# Patient Record
Sex: Male | Born: 1975 | Race: White | Hispanic: No | Marital: Married | Smoking: Never smoker
Health system: Southern US, Community
[De-identification: ages and names within clinical notes are randomized; demographics above are authoritative.]

## PROBLEM LIST (undated history)

## (undated) DIAGNOSIS — R030 Elevated blood-pressure reading, without diagnosis of hypertension: Secondary | ICD-10-CM

## (undated) DIAGNOSIS — Z85828 Personal history of other malignant neoplasm of skin: Secondary | ICD-10-CM

## (undated) DIAGNOSIS — R002 Palpitations: Secondary | ICD-10-CM

## (undated) DIAGNOSIS — IMO0001 Reserved for inherently not codable concepts without codable children: Secondary | ICD-10-CM

## (undated) DIAGNOSIS — F419 Anxiety disorder, unspecified: Secondary | ICD-10-CM

## (undated) HISTORY — PX: OTHER SURGICAL HISTORY: SHX169

## (undated) HISTORY — DX: Palpitations: R00.2

## (undated) HISTORY — DX: Elevated blood-pressure reading, without diagnosis of hypertension: R03.0

## (undated) HISTORY — DX: Reserved for inherently not codable concepts without codable children: IMO0001

## (undated) HISTORY — DX: Anxiety disorder, unspecified: F41.9

---
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
## 2006-07-17 DIAGNOSIS — Z86018 Personal history of other benign neoplasm: Secondary | ICD-10-CM

## 2006-07-17 HISTORY — DX: Personal history of other benign neoplasm: Z86.018

## 2006-12-12 ENCOUNTER — Ambulatory Visit: Payer: Self-pay | Admitting: Family Medicine

## 2006-12-12 DIAGNOSIS — L600 Ingrowing nail: Secondary | ICD-10-CM | POA: Insufficient documentation

## 2006-12-12 DIAGNOSIS — L2089 Other atopic dermatitis: Secondary | ICD-10-CM | POA: Insufficient documentation

## 2006-12-12 DIAGNOSIS — H9209 Otalgia, unspecified ear: Secondary | ICD-10-CM | POA: Insufficient documentation

## 2007-02-25 ENCOUNTER — Ambulatory Visit: Payer: Self-pay | Admitting: Family Medicine

## 2007-02-25 DIAGNOSIS — D485 Neoplasm of uncertain behavior of skin: Secondary | ICD-10-CM | POA: Insufficient documentation

## 2007-06-07 ENCOUNTER — Encounter (INDEPENDENT_AMBULATORY_CARE_PROVIDER_SITE_OTHER): Payer: Self-pay | Admitting: *Deleted

## 2007-06-10 ENCOUNTER — Ambulatory Visit: Payer: Self-pay | Admitting: Family Medicine

## 2007-06-10 DIAGNOSIS — R55 Syncope and collapse: Secondary | ICD-10-CM | POA: Insufficient documentation

## 2007-06-24 DIAGNOSIS — D239 Other benign neoplasm of skin, unspecified: Secondary | ICD-10-CM

## 2007-06-24 HISTORY — DX: Other benign neoplasm of skin, unspecified: D23.9

## 2010-08-12 ENCOUNTER — Emergency Department: Payer: Self-pay | Admitting: Emergency Medicine

## 2010-08-16 ENCOUNTER — Ambulatory Visit: Payer: Self-pay | Admitting: Internal Medicine

## 2010-10-16 HISTORY — PX: TRANSTHORACIC ECHOCARDIOGRAM: SHX275

## 2010-10-16 HISTORY — PX: OTHER SURGICAL HISTORY: SHX169

## 2010-12-17 ENCOUNTER — Encounter: Payer: Self-pay | Admitting: Family Medicine

## 2010-12-21 ENCOUNTER — Encounter: Payer: Self-pay | Admitting: Family Medicine

## 2010-12-21 ENCOUNTER — Ambulatory Visit (INDEPENDENT_AMBULATORY_CARE_PROVIDER_SITE_OTHER): Payer: Managed Care, Other (non HMO) | Admitting: Family Medicine

## 2010-12-21 DIAGNOSIS — Z0000 Encounter for general adult medical examination without abnormal findings: Secondary | ICD-10-CM

## 2010-12-21 DIAGNOSIS — R002 Palpitations: Secondary | ICD-10-CM | POA: Insufficient documentation

## 2010-12-21 LAB — COMPREHENSIVE METABOLIC PANEL
ALT: 17 U/L (ref 0–53)
AST: 17 U/L (ref 0–37)
Albumin: 4.8 g/dL (ref 3.5–5.2)
BUN: 10 mg/dL (ref 6–23)
Calcium: 9.2 mg/dL (ref 8.4–10.5)
Chloride: 106 mEq/L (ref 96–112)
Potassium: 4.5 mEq/L (ref 3.5–5.1)
Sodium: 141 mEq/L (ref 135–145)
Total Protein: 7.7 g/dL (ref 6.0–8.3)

## 2010-12-21 LAB — CBC WITH DIFFERENTIAL/PLATELET
Eosinophils Absolute: 0 10*3/uL (ref 0.0–0.7)
Lymphs Abs: 1.1 10*3/uL (ref 0.7–4.0)
MCHC: 34.4 g/dL (ref 30.0–36.0)
MCV: 95.6 fl (ref 78.0–100.0)
Monocytes Absolute: 0.6 10*3/uL (ref 0.1–1.0)
Neutrophils Relative %: 71.8 % (ref 43.0–77.0)
Platelets: 315 10*3/uL (ref 150.0–400.0)

## 2010-12-21 LAB — LIPID PANEL
Cholesterol: 195 mg/dL (ref 0–200)
Total CHOL/HDL Ratio: 5
Triglycerides: 59 mg/dL (ref 0.0–149.0)

## 2010-12-21 NOTE — Patient Instructions (Signed)
I am reassured by your story and cardiac work up so far  Please send for last [facility] ER notes/ labs  Please send for notes from [facility] cardiology  Labs today If you want to try low dose beta blocker ( to lower bp and pulse and poss help anxiety)- call and let me know  If you want to see a counselor for stress issues- let me know

## 2010-12-21 NOTE — Progress Notes (Signed)
Subjective:    Patient ID: William Macias, male    DOB: 09-12-1975, 35 y.o.   MRN: [id number]  HPI Here to get est for care  Saw Dr Ermalene Searing back in 08 Then saw Dr Burnell Blanks at [facility]- few months ago   (this is much closer0 Overall very healthy    Has elevated bp  A few months ago - was having trouble sleeping - for palpitations- heart felt like it was beating out of his chest (beating hard) Went to [facility] and bp was very elevated -- so much that they sent him to the ER ([facility])  Did blood tests and EKG and then -- went to [facility] cardiol after that (did echo and holter monitor)  Did not do stress tests   Still has "hard" heartbeat at night  They put him on generic xanax and something for bp (thinks B blocker possibly) -- small doses  Not on it right now  Took both drugs for 1 month -- they helped a little - but not a big difference   bp at cardiology may have been a bit high   Never had anxiety in past or seen a counselor  His mother is doing well  That is improved - but also has financial worries  Lost a pet in January- very hard on him    Mother had cabg   Grandparent with colon cancer   Td 2012-- was in feb Had a physical in February - all was ok  Did not do blood work due to having eaten   Patient Active Problem List  Diagnoses  . NEOPLASM, SKIN, UNCERTAIN BEHAVIOR  . DERMATITIS, OTHER ATOPIC  . SYNCOPE, VASOVAGAL  . Routine general medical examination at a health care facility  . Palpitations   Past Medical History  Diagnosis Date  . Elevated BP   . Palpitations   . Anxiety    Past Surgical History  Procedure Date  . None   . Transthoracic echocardiogram 4/12    normal per pt [facility]  . Holter monitor 4/12    nl per pt [facility]   History  Substance Use Topics  . Smoking status: Never Smoker   . Smokeless tobacco: Not on file  . Alcohol Use: No   Family History  Problem Relation Age of Onset  . Depression  Mother   . Arthritis Mother   . Heart disease Mother     had triple by pass  . Alcohol abuse Maternal Grandfather   . Cancer Maternal Grandfather     colon cA  . Heart disease Maternal Grandmother   . Diabetes Maternal Grandmother    No Known Allergies Current Outpatient Prescriptions on File Prior to Visit  Medication Sig Dispense Refill  . desonide (DESOWEN) 0.05 % cream Apply to ear canals bilaterally two times a day as needed for dry skin             Review of Systems Review of Systems  Constitutional: Negative for fever, appetite change, fatigue and unexpected weight change.  Eyes: Negative for pain and visual disturbance.  Respiratory: Negative for cough and shortness of breath.   Cardiovascular: Negative.  for cp or sob/ pos for palpitations Gastrointestinal: Negative for nausea, diarrhea and constipation.  Genitourinary: Negative for urgency and frequency.  Skin: Negative for pallor. or rash Neurological: Negative for weakness, light-headedness, numbness and headaches.  Hematological: Negative for adenopathy. Does not bruise/bleed easily.  Psychiatric/Behavioral: Negative for dysphoric mood. Some anxiety  and worry        Objective:   Physical Exam  Constitutional: He appears well-developed and well-nourished. No distress.  HENT:  Head: Normocephalic and atraumatic.  Right Ear: External ear normal.  Left Ear: External ear normal.  Nose: Nose normal.  Mouth/Throat: Oropharynx is clear and moist.  Eyes: Conjunctivae and EOM are normal. Pupils are equal, round, and reactive to light.  Neck: Normal range of motion. Neck supple. No JVD present. Carotid bruit is not present. No thyromegaly present.  Cardiovascular: Normal rate, regular rhythm, normal heart sounds and intact distal pulses.   No murmur heard. Pulmonary/Chest: Breath sounds normal. No respiratory distress. He has no wheezes.  Abdominal: Soft. Bowel sounds are normal. He exhibits no distension, no  abdominal bruit and no mass. There is no tenderness.  Musculoskeletal: He exhibits no edema and no tenderness.  Lymphadenopathy:    He has no cervical adenopathy.  Neurological: He is alert. He has normal reflexes. Coordination normal.  Skin: Skin is warm and dry. No rash noted. No erythema. No pallor.  Psychiatric:       Pt seems mildly anxious Normal eye contact and communication skills          Assessment & Plan:

## 2010-12-21 NOTE — Assessment & Plan Note (Signed)
Rev story in detail S/p nl cardiac work up - sent for that  Suspect anx and stress rxn play a role Given opt of beta blocker- will think about that Also offered counseling Check cbc and tsh today

## 2010-12-21 NOTE — Assessment & Plan Note (Signed)
Pt already had PE --just needs lab today

## 2010-12-22 ENCOUNTER — Encounter: Payer: Self-pay | Admitting: Family Medicine

## 2010-12-27 ENCOUNTER — Telehealth: Payer: Self-pay

## 2010-12-27 NOTE — Telephone Encounter (Signed)
Patient notified as instructed by telephone. Fasting Lab appt scheduled as instructed 06/28/11 8:15with f/u appt with Dr Milinda Antis 07/04/11 at 8:00.

## 2010-12-27 NOTE — Telephone Encounter (Signed)
Message copied by Patience Musca on Tue Dec 27, 2010  6:19 PM ------      Message from: Roxy Manns A      Created: Thu Dec 22, 2010  7:39 PM       Cholesterol is just a little high (other labs are fine )      LDL in 140s and goal is under 130 (even better under 100)      Avoid red meat/ fried foods/ egg yolks/ fatty breakfast meats/ butter, cheese and high fat dairy/ and shellfish       Recheck lipid panel 272 in 6 months after watching diet and then f/u please

## 2011-06-20 ENCOUNTER — Other Ambulatory Visit: Payer: Self-pay | Admitting: Family Medicine

## 2011-06-20 DIAGNOSIS — E7800 Pure hypercholesterolemia, unspecified: Secondary | ICD-10-CM

## 2011-06-28 ENCOUNTER — Other Ambulatory Visit (INDEPENDENT_AMBULATORY_CARE_PROVIDER_SITE_OTHER): Payer: Managed Care, Other (non HMO)

## 2011-06-28 DIAGNOSIS — E7800 Pure hypercholesterolemia, unspecified: Secondary | ICD-10-CM

## 2011-06-28 LAB — LIPID PANEL
HDL: 39.4 mg/dL (ref >39.00)
Total CHOL/HDL Ratio: 5
Triglycerides: 77 mg/dL (ref 0.0–149.0)
VLDL: 15.4 mg/dL (ref 0.0–40.0)

## 2011-07-04 ENCOUNTER — Encounter: Payer: Self-pay | Admitting: Family Medicine

## 2011-07-04 ENCOUNTER — Ambulatory Visit (INDEPENDENT_AMBULATORY_CARE_PROVIDER_SITE_OTHER): Payer: Managed Care, Other (non HMO) | Admitting: Family Medicine

## 2011-07-04 VITALS — BP 132/78 | HR 76 | Temp 97.7°F | Ht 71.5 in | Wt 174.5 lb

## 2011-07-04 DIAGNOSIS — R002 Palpitations: Secondary | ICD-10-CM

## 2011-07-04 DIAGNOSIS — Z23 Encounter for immunization: Secondary | ICD-10-CM

## 2011-07-04 DIAGNOSIS — E785 Hyperlipidemia, unspecified: Secondary | ICD-10-CM | POA: Insufficient documentation

## 2011-07-04 NOTE — Assessment & Plan Note (Signed)
These seem to be stress related- and not bothersome enough to treat Pt has had cardiac w/u He is aware that B blocker is an option if they become more of a problem

## 2011-07-04 NOTE — Patient Instructions (Signed)
Avoid red meat/ fried foods/ egg yolks/ fatty breakfast meats/ butter, cheese and high fat dairy/ and shellfish   Cholesterol looks better  Try to exercise regularly outdoors or indoors  Your physical will be due June- July

## 2011-07-04 NOTE — Assessment & Plan Note (Signed)
Lipids are improved with better diet (less beef) Disc goals for lipids and reasons to control them Rev labs with pt Rev low sat fat diet in detail

## 2011-07-04 NOTE — Progress Notes (Signed)
Subjective:    Patient ID: William Macias, male    DOB: 1975-08-06, 35 y.o.   MRN: [id number]  HPI Here for f/u of lipids and palpitations  bp is 132/78  Wt is up 4 lb  Lipids are improved Lab Results  Component Value Date   CHOL 180 06/28/2011   CHOL 195 12/21/2010   Lab Results  Component Value Date   HDL 39.40 06/28/2011   HDL 40.00 12/21/2010   Lab Results  Component Value Date   LDLCALC 125* 06/28/2011   LDLCALC 143* 12/21/2010   Lab Results  Component Value Date   TRIG 77.0 06/28/2011   TRIG 59.0 12/21/2010   Lab Results  Component Value Date   CHOLHDL 5 06/28/2011   CHOLHDL 5 12/21/2010   No results found for this basename: LDLDIRECT   LDL is down from 143 to 125 Did a great job  Is now cutting out steak and red meat and walking 30 minutes a day -- aerobic exercise  Is staying away from fried foods more than before  Mother had CAD with triple bypass , ? Chol status   Palpitations - are improved overall, they come and go  Notices it when he goes to bed  Is not bothered by it   Patient Active Problem List  Diagnoses  . DERMATITIS, OTHER ATOPIC  . SYNCOPE, VASOVAGAL  . Routine general medical examination at a health care facility  . Palpitations  . Hyperlipidemia   Past Medical History  Diagnosis Date  . Elevated BP   . Palpitations   . Anxiety    Past Surgical History  Procedure Date  . None   . Transthoracic echocardiogram 4/12    normal per pt [facility]  . Holter monitor 4/12    nl per pt [facility]   History  Substance Use Topics  . Smoking status: Never Smoker   . Smokeless tobacco: Not on file  . Alcohol Use: No   Family History  Problem Relation Age of Onset  . Depression Mother   . Arthritis Mother   . Heart disease Mother     had triple by pass  . Alcohol abuse Maternal Grandfather   . Cancer Maternal Grandfather     colon cA  . Heart disease Maternal Grandmother   . Diabetes Maternal Grandmother    No Known  Allergies Current Outpatient Prescriptions on File Prior to Visit  Medication Sig Dispense Refill  . desonide (DESOWEN) 0.05 % cream Apply to ear canals bilaterally two times a day as needed for dry skin          Review of Systems Review of Systems  Constitutional: Negative for fever, appetite change, fatigue and unexpected weight change.  Eyes: Negative for pain and visual disturbance.  Respiratory: Negative for cough and shortness of breath.   Cardiovascular: Negative for cp or sob Gastrointestinal: Negative for nausea, diarrhea and constipation.  Genitourinary: Negative for urgency and frequency.  Skin: Negative for pallor or rash   Neurological: Negative for weakness, light-headedness, numbness and headaches.  Hematological: Negative for adenopathy. Does not bruise/bleed easily.  Psychiatric/Behavioral: Negative for dysphoric mood. The patient is not nervous/anxious.  but has a lot of stress with new baby on the way/ work/ etc         Objective:   Physical Exam  Constitutional: He appears well-developed and well-nourished. No distress.  HENT:  Head: Normocephalic and atraumatic.  Mouth/Throat: Oropharynx is clear and moist.  Eyes: Conjunctivae and EOM  are normal. Pupils are equal, round, and reactive to light. No scleral icterus.  Neck: Normal range of motion. Neck supple. No JVD present. Carotid bruit is not present. No thyromegaly present.  Cardiovascular: Normal rate, regular rhythm, normal heart sounds and intact distal pulses.  Exam reveals no gallop.   Pulmonary/Chest: Effort normal and breath sounds normal. No respiratory distress. He has no wheezes.  Musculoskeletal: He exhibits no edema.  Lymphadenopathy:    He has no cervical adenopathy.  Neurological: He is alert. He has normal reflexes. Coordination normal.  Skin: Skin is warm and dry. No pallor.  Psychiatric: He has a normal mood and affect.          Assessment & Plan:

## 2012-01-02 ENCOUNTER — Encounter: Payer: Self-pay | Admitting: Family Medicine

## 2012-01-02 ENCOUNTER — Ambulatory Visit (INDEPENDENT_AMBULATORY_CARE_PROVIDER_SITE_OTHER): Payer: Managed Care, Other (non HMO) | Admitting: Family Medicine

## 2012-01-02 VITALS — BP 116/76 | HR 64 | Temp 97.9°F | Ht 72.5 in | Wt 172.5 lb

## 2012-01-02 DIAGNOSIS — E785 Hyperlipidemia, unspecified: Secondary | ICD-10-CM

## 2012-01-02 DIAGNOSIS — Z0000 Encounter for general adult medical examination without abnormal findings: Secondary | ICD-10-CM

## 2012-01-02 LAB — COMPREHENSIVE METABOLIC PANEL
BUN: 13 mg/dL (ref 6–23)
CO2: 27 mEq/L (ref 19–32)
Calcium: 8.4 mg/dL (ref 8.4–10.5)
Chloride: 108 mEq/L (ref 96–112)
Creatinine, Ser: 1 mg/dL (ref 0.4–1.5)
GFR: 92.06 mL/min (ref >60.00)

## 2012-01-02 LAB — TSH: TSH: 1.27 u[IU]/mL (ref 0.35–5.50)

## 2012-01-02 LAB — CBC WITH DIFFERENTIAL/PLATELET
Basophils Relative: 0.8 % (ref 0.0–3.0)
Eosinophils Absolute: 0 10*3/uL (ref 0.0–0.7)
Lymphocytes Relative: 21.3 % (ref 12.0–46.0)
MCV: 95.5 fl (ref 78.0–100.0)
Monocytes Relative: 10.9 % (ref 3.0–12.0)
Neutro Abs: 3.3 10*3/uL (ref 1.4–7.7)
Neutrophils Relative %: 66.2 % (ref 43.0–77.0)
Platelets: 302 10*3/uL (ref 150.0–400.0)
RDW: 13.7 % (ref 11.5–14.6)
WBC: 5 10*3/uL (ref 4.5–10.5)

## 2012-01-02 LAB — LIPID PANEL
LDL Cholesterol: 118 mg/dL — ABNORMAL HIGH (ref 0–99)
VLDL: 12 mg/dL (ref 0.0–40.0)

## 2012-01-02 NOTE — Assessment & Plan Note (Signed)
Lipids mildly high in the past- rev Rev low sat fat diet  Lab today

## 2012-01-02 NOTE — Patient Instructions (Addendum)
Labs today Keep working on healthy diet and exercise  Wear sun protection  I'm glad you are doing well

## 2012-01-02 NOTE — Assessment & Plan Note (Signed)
Reviewed health habits including diet and exercise and skin cancer prevention Also reviewed health mt list, fam hx and immunizations  Wellness labs today 

## 2012-01-02 NOTE — Progress Notes (Signed)
Subjective:    Patient ID: William Macias, male    DOB: [birth year], 36 y.o.   MRN: [id number]  HPI Here for health maintenance exam and to review chronic medical problems   Is feeling good  Much better actually   Has a new baby - was 1 mo premature but no problems  She was 2 mo on Friday  Is getting a fair amt of sleep- for the most part an easy baby who sleeps well   bp good 116/76 Wt is down 2 lb with bmi of 23  Health habits Is eating a fairly healthy diet - gets enough fruit and veggies/ no fast food  Some exercise - on and off  Work is ok - a little slow right now   Needs to do labs today   Lipids due for a check Previous Lab Results  Component Value Date   CHOL 180 06/28/2011   HDL 39.40 06/28/2011   LDLCALC 125* 06/28/2011   TRIG 77.0 06/28/2011   CHOLHDL 5 06/28/2011   diet- overall a little better , could always be better  Avoids fried foods and red meat for the most part occ southern breakfast   Td 2/12  Had a flu shot   No new family hx of illness  Patient Active Problem List  Diagnosis  . DERMATITIS, OTHER ATOPIC  . SYNCOPE, VASOVAGAL  . Routine general medical examination at a health care facility  . Palpitations  . Hyperlipidemia   Past Medical History  Diagnosis Date  . Elevated BP   . Palpitations   . Anxiety    Past Surgical History  Procedure Date  . None   . Transthoracic echocardiogram 4/12    normal per pt [facility]  . Holter monitor 4/12    nl per pt [facility]   History  Substance Use Topics  . Smoking status: Never Smoker   . Smokeless tobacco: Not on file  . Alcohol Use: No   Family History  Problem Relation Age of Onset  . Depression Mother   . Arthritis Mother   . Heart disease Mother     had triple by pass  . Alcohol abuse Maternal Grandfather   . Cancer Maternal Grandfather     colon cA  . Heart disease Maternal Grandmother   . Diabetes Maternal Grandmother    No Known Allergies Current  Outpatient Prescriptions on File Prior to Visit  Medication Sig Dispense Refill  . desonide (DESOWEN) 0.05 % cream Apply to ear canals bilaterally two times a day as needed for dry skin             Review of Systems Review of Systems  Constitutional: Negative for fever, appetite change, fatigue and unexpected weight change.  Eyes: Negative for pain and visual disturbance.  Respiratory: Negative for cough and shortness of breath.   Cardiovascular: Negative for cp or palpitations    Gastrointestinal: Negative for nausea, diarrhea and constipation.  Genitourinary: Negative for urgency and frequency.  Skin: Negative for pallor or rash   Neurological: Negative for weakness, light-headedness, numbness and headaches.  Hematological: Negative for adenopathy. Does not bruise/bleed easily.  Psychiatric/Behavioral: Negative for dysphoric mood. The patient is not nervous/anxious.         Objective:   Physical Exam  Constitutional: He appears well-developed and well-nourished. No distress.  HENT:  Head: Normocephalic and atraumatic.  Right Ear: External ear normal.  Left Ear: External ear normal.  Nose: Nose normal.  Mouth/Throat: Oropharynx is  clear and moist.  Eyes: Conjunctivae and EOM are normal. Pupils are equal, round, and reactive to light. No scleral icterus.  Neck: Normal range of motion. Neck supple. No JVD present. Carotid bruit is not present. No thyromegaly present.  Cardiovascular: Normal rate, regular rhythm, normal heart sounds and intact distal pulses.  Exam reveals no gallop.   Pulmonary/Chest: Effort normal and breath sounds normal. No respiratory distress. He has no wheezes.  Abdominal: Soft. Bowel sounds are normal. He exhibits no distension, no abdominal bruit and no mass. There is no tenderness.  Musculoskeletal: Normal range of motion. He exhibits no edema and no tenderness.  Lymphadenopathy:    He has no cervical adenopathy.  Neurological: He is alert. He has  normal reflexes. No cranial nerve deficit. He exhibits normal muscle tone. Coordination normal.  Skin: Skin is warm and dry. No rash noted. No erythema. No pallor.  Psychiatric: He has a normal mood and affect.          Assessment & Plan:

## 2012-03-13 ENCOUNTER — Ambulatory Visit (INDEPENDENT_AMBULATORY_CARE_PROVIDER_SITE_OTHER): Payer: Managed Care, Other (non HMO) | Admitting: Family Medicine

## 2012-03-13 ENCOUNTER — Encounter: Payer: Self-pay | Admitting: Family Medicine

## 2012-03-13 VITALS — BP 128/82 | HR 74 | Temp 98.4°F | Ht 73.0 in | Wt 178.8 lb

## 2012-03-13 DIAGNOSIS — T148XXA Other injury of unspecified body region, initial encounter: Secondary | ICD-10-CM

## 2012-03-13 DIAGNOSIS — W57XXXA Bitten or stung by nonvenomous insect and other nonvenomous arthropods, initial encounter: Secondary | ICD-10-CM

## 2012-03-13 MED ORDER — MUPIROCIN 2 % EX OINT: TOPICAL_OINTMENT | Freq: Every day | CUTANEOUS | Status: AC

## 2012-03-13 MED ORDER — MOMETASONE FUROATE 0.1 % EX CREA: TOPICAL_CREAM | Freq: Every day | CUTANEOUS | Status: AC

## 2012-03-13 NOTE — Assessment & Plan Note (Signed)
1-2 cm area of erythema and induration on L forearm with small scab consistent with insect bite  No abcess/ bruising noted  Will tx with bactroban qd and elocon cream qd  (am/ pm) Disc keeping clean Will call if worse/ pain/ rash or fever (or other symptoms)

## 2012-03-13 NOTE — Progress Notes (Signed)
  Subjective:    Patient ID: William Macias, male    DOB: May 08, 1976, 36 y.o.   MRN: [id number]  HPI Here with an insect bite on his L arm  Worried it may have been a spider   Woke up with a red area on his L forearm on Monday  Is a little sore  Does not remember being bitten A little dark around the area of red   Never saw a tick   Was outdoors at a pool on Sunday- there were a lot of bugs around   No fever or rash No itching   Patient Active Problem List  Diagnosis  . DERMATITIS, OTHER ATOPIC  . SYNCOPE, VASOVAGAL  . Routine general medical examination at a health care facility  . Hyperlipidemia   Past Medical History  Diagnosis Date  . Elevated BP   . Palpitations   . Anxiety    Past Surgical History  Procedure Date  . None   . Transthoracic echocardiogram 4/12    normal per pt [facility]  . Holter monitor 4/12    nl per pt [facility]   History  Substance Use Topics  . Smoking status: Never Smoker   . Smokeless tobacco: Not on file  . Alcohol Use: No   Family History  Problem Relation Age of Onset  . Depression Mother   . Arthritis Mother   . Heart disease Mother     had triple by pass  . Alcohol abuse Maternal Grandfather   . Cancer Maternal Grandfather     colon cA  . Heart disease Maternal Grandmother   . Diabetes Maternal Grandmother    No Known Allergies Current Outpatient Prescriptions on File Prior to Visit  Medication Sig Dispense Refill  . desonide (DESOWEN) 0.05 % cream Apply to ear canals bilaterally two times a day as needed for dry skin           Review of Systems     Objective:   Physical Exam  Constitutional: He appears well-developed and well-nourished. No distress.  HENT:  Head: Normocephalic and atraumatic.  Eyes: Conjunctivae and EOM are normal. Pupils are equal, round, and reactive to light. Right eye exhibits no discharge.  Neck: Normal range of motion. Neck supple.  Cardiovascular: Normal rate and regular  rhythm.   Pulmonary/Chest: Effort normal and breath sounds normal. He has no wheezes.  Lymphadenopathy:    He has no cervical adenopathy.  Skin: Skin is warm and dry. No rash noted. There is erythema. No pallor.       L forearm 1-2 cm round area of erythema and induration - tiny scab in middle Is warm Consistent with insect bite  No rash  Psychiatric: He has a normal mood and affect.          Assessment & Plan:

## 2012-03-13 NOTE — Patient Instructions (Addendum)
Keep insect bite clean with antibacterial soap and water  Use elocon cream (steroid cream) once daily , and the bactroban (antibiotic) once daily  If worse , increased redness, fever, rash -- or not improving- please call  Warm compress is ok also

## 2014-04-24 ENCOUNTER — Ambulatory Visit (INDEPENDENT_AMBULATORY_CARE_PROVIDER_SITE_OTHER): Payer: BC Managed Care – PPO | Admitting: Family Medicine

## 2014-04-24 ENCOUNTER — Encounter: Payer: Self-pay | Admitting: Family Medicine

## 2014-04-24 VITALS — BP 104/64 | HR 79 | Temp 98.5°F | Ht 73.0 in | Wt 174.5 lb

## 2014-04-24 DIAGNOSIS — J209 Acute bronchitis, unspecified: Secondary | ICD-10-CM | POA: Insufficient documentation

## 2014-04-24 MED ORDER — BENZONATATE 200 MG PO CAPS: 200.0000 mg | ORAL_CAPSULE | Freq: Three times a day (TID) | ORAL | Status: DC | PRN

## 2014-04-24 MED ORDER — GUAIFENESIN-CODEINE 100-10 MG/5ML PO SYRP: 5.0000 mL | ORAL_SOLUTION | Freq: Four times a day (QID) | ORAL | Status: DC | PRN

## 2014-04-24 MED ORDER — AZITHROMYCIN 250 MG PO TABS: ORAL_TABLET | ORAL | Status: DC

## 2014-04-24 NOTE — Progress Notes (Signed)
Subjective:    Patient ID: William Macias, male    DOB: 11-Jan-1976, 38 y.o.   MRN: [id number]  HPI Here for uri symptoms  Sick for at least 3 weeks  Is getting worse instead of better   Still coughing -prod-- with a sore chest from coughing - is bringing up yellow mucous  Runny and stuffy nose   No fever since the beginning   No wheezing   Using delsym -not a lot of help   Patient Active Problem List   Diagnosis Date Noted  . Acute bronchitis 04/24/2014  . Insect bite 03/13/2012  . Hyperlipidemia 07/04/2011  . Routine general medical examination at a health care facility 12/21/2010  . SYNCOPE, VASOVAGAL 06/10/2007  . DERMATITIS, OTHER ATOPIC 12/12/2006   Past Medical History  Diagnosis Date  . Elevated BP   . Palpitations   . Anxiety    Past Surgical History  Procedure Laterality Date  . None    . Transthoracic echocardiogram  4/12    normal per pt [facility]  . Holter monitor  4/12    nl per pt [facility]   History  Substance Use Topics  . Smoking status: Never Smoker   . Smokeless tobacco: Not on file  . Alcohol Use: No   Family History  Problem Relation Age of Onset  . Depression Mother   . Arthritis Mother   . Heart disease Mother     had triple by pass  . Alcohol abuse Maternal Grandfather   . Cancer Maternal Grandfather     colon cA  . Heart disease Maternal Grandmother   . Diabetes Maternal Grandmother    No Known Allergies No current outpatient prescriptions on file prior to visit.   No current facility-administered medications on file prior to visit.    Review of Systems    Review of Systems  Constitutional: Negative for fever, appetite change,  and unexpected weight change.  ENT pos for cong and rhinorrhea and neg for sinus pain  Eyes: Negative for pain and visual disturbance.  Respiratory: Negative for wheeze  and shortness of breath.   Cardiovascular: Negative for cp or palpitations    Gastrointestinal: Negative for  nausea, diarrhea and constipation.  Genitourinary: Negative for urgency and frequency.  Skin: Negative for pallor or rash   Neurological: Negative for weakness, light-headedness, numbness and headaches.  Hematological: Negative for adenopathy. Does not bruise/bleed easily.  Psychiatric/Behavioral: Negative for dysphoric mood. The patient is not nervous/anxious.      Objective:   Physical Exam  Constitutional: He appears well-developed and well-nourished. No distress.  HENT:  Head: Normocephalic and atraumatic.  Right Ear: External ear normal.  Left Ear: External ear normal.  Mouth/Throat: Oropharynx is clear and moist. No oropharyngeal exudate.  Nares are injected and congested  No sinus tenderness  Throat clear    Eyes: Conjunctivae and EOM are normal. Pupils are equal, round, and reactive to light. Right eye exhibits no discharge. Left eye exhibits no discharge.  Neck: Normal range of motion. Neck supple.  Cardiovascular: Normal rate, regular rhythm and normal heart sounds.   Pulmonary/Chest: Effort normal and breath sounds normal. No respiratory distress. He has no wheezes. He has no rales.  Harsh bs Few scattered rhonchi    Lymphadenopathy:    He has no cervical adenopathy.  Neurological: He is alert.  Skin: Skin is warm and dry. No rash noted.  Psychiatric: He has a normal mood and affect.  Assessment & Plan:   Problem List Items Addressed This Visit     Respiratory   Acute bronchitis - Primary     3 wk s/p uri with a cyclic cough  Will control cough with tessalon and robitussin with cod (at night) Fluid/rest zpack as directed  Update if not starting to improve in a week or if worsening

## 2014-04-24 NOTE — Progress Notes (Signed)
Pre visit review using our clinic review tool, if applicable. No additional management support is needed unless otherwise documented below in the visit note. 

## 2014-04-24 NOTE — Patient Instructions (Signed)
Drink lots of fluids and rest  Take zithromax as directed  Tessalon three time a day for cough  At night - robitussin AC  Update if not starting to improve in a week or if worsening

## 2014-04-24 NOTE — Assessment & Plan Note (Signed)
3 wk s/p uri with a cyclic cough  Will control cough with tessalon and robitussin with cod (at night) Fluid/rest zpack as directed  Update if not starting to improve in a week or if worsening

## 2015-05-18 DIAGNOSIS — Z85828 Personal history of other malignant neoplasm of skin: Secondary | ICD-10-CM

## 2015-05-18 HISTORY — DX: Personal history of other malignant neoplasm of skin: Z85.828

## 2015-05-25 HISTORY — PX: BASAL CELL CARCINOMA EXCISION: SHX1214

## 2016-11-23 ENCOUNTER — Encounter: Payer: Self-pay | Admitting: Family Medicine

## 2016-11-23 ENCOUNTER — Ambulatory Visit (INDEPENDENT_AMBULATORY_CARE_PROVIDER_SITE_OTHER): Payer: 59 | Admitting: Family Medicine

## 2016-11-23 ENCOUNTER — Ambulatory Visit (INDEPENDENT_AMBULATORY_CARE_PROVIDER_SITE_OTHER)
Admission: RE | Admit: 2016-11-23 | Discharge: 2016-11-23 | Disposition: A | Discharge status: Home / Self Care | Payer: 59 | Source: Ambulatory Visit | Attending: Family Medicine | Admitting: Family Medicine

## 2016-11-23 VITALS — BP 144/86 | HR 93 | Temp 98.3°F | Ht 73.0 in | Wt 178.0 lb

## 2016-11-23 DIAGNOSIS — R05 Cough: Secondary | ICD-10-CM | POA: Diagnosis not present

## 2016-11-23 DIAGNOSIS — R042 Hemoptysis: Secondary | ICD-10-CM

## 2016-11-23 MED ORDER — GUAIFENESIN-CODEINE 100-10 MG/5ML PO SYRP: 5.0000 mL | ORAL_SOLUTION | Freq: Two times a day (BID) | ORAL | 0 refills | Status: DC | PRN

## 2016-11-23 NOTE — Patient Instructions (Addendum)
I think blood may have been from burst capillary which should heal with time. I do think you have head cold - treat with fluids, rest, may try codeine cough syrup.   Use nasal saline as well.  Xray today.  Let us know if ongoing cough with blood or just not improving with treatment.  If ongoing congestion past 10 days, fever >101 let us know.

## 2016-11-23 NOTE — Assessment & Plan Note (Signed)
Isolated episode this morning, sounds small amt but more than blood tinged. Anticipate burst capillary from intense cough episode. Anticipate due to head cold. Discussed this. rec supportive care, fluids, rest, will Rx cheratussin codeine cough syrup. He coughed in office without any further bleed. Baseline CXR today. Doubt TB, PE, cancer, other more ominous cause. He will let us know if ongoing or recurrent - or if bacterial sinusitis sxs develop. Pt agrees with plan, reassured.

## 2016-11-23 NOTE — Progress Notes (Signed)
BP (!) 144/86   Pulse 93   Temp 98.3 F (36.8 C) (Oral)   Ht 6\' 1"  (1.854 m)   Wt 178 lb (80.7 kg)   SpO2 98%   BMI 23.48 kg/m    CC: cough Subjective:    Patient ID: William Macias, male    DOB: 04/19/1976, 41 y.o.   MRN: [id number]  HPI: William Macias is a 41 y.o. male presenting on 11/23/2016 for Cough (nasal congwation,blood in mucous with cough x 2 days)   2d h/o nasal congestion, ST, productive cough. Yesterday cough productive of phlegm and again this morning - noticed hemoptysis in phlegm - more than just blood tinged sputum. Did not notice any clots.  No fevers/chills, ear or tooth pain, abd pain, nausea, dyspnea or wheezing, or PNdrainage. No night sweats, unexpected weight loss.  Treating with aleve cold and sinus, as well as nyquil.  MIL sick recently with similar cough.  No recent travel or prolonged car or plane rides. No known TB exposure. No work at prison, homeless shelter, or healthcare setting. No personal or fmhx blood clots.  No h/o asthma.  No h/o allergies.  40 min commute to work.  Colon cancer in family. No other cancer.   Relevant past medical, surgical, family and social history reviewed and updated as indicated. Interim medical history since our last visit reviewed. Allergies and medications reviewed and updated. Outpatient Medications Prior to Visit  Medication Sig Dispense Refill  . Pseudoephedrine-Naproxen Na (ALEVE COLD & SINUS PO) Take by mouth.    Marland Kitchen azithromycin (ZITHROMAX Z-PAK) 250 MG tablet Take 2 pills by mouth today and then 1 pill daily for 4 days 6 tablet 0  . benzonatate (TESSALON) 200 MG capsule Take 1 capsule (200 mg total) by mouth 3 (three) times daily as needed for cough (swallow whole). 30 capsule 0  . dextromethorphan (DELSYM) 30 MG/5ML liquid Take by mouth as needed for cough.    Marland Kitchen guaiFENesin (MUCINEX) 600 MG 12 hr tablet Take by mouth 2 (two) times daily.    Marland Kitchen guaiFENesin-codeine (ROBITUSSIN AC) 100-10 MG/5ML syrup Take 5  mLs by mouth 4 (four) times daily as needed for cough. 120 mL 0   No facility-administered medications prior to visit.      Per HPI unless specifically indicated in ROS section below Review of Systems     Objective:    BP (!) 144/86   Pulse 93   Temp 98.3 F (36.8 C) (Oral)   Ht 6\' 1"  (1.854 m)   Wt 178 lb (80.7 kg)   SpO2 98%   BMI 23.48 kg/m   Wt Readings from Last 3 Encounters:  11/23/16 178 lb (80.7 kg)  04/24/14 174 lb 8 oz (79.2 kg)  03/13/12 178 lb 12 oz (81.1 kg)    Physical Exam  Constitutional: He appears well-developed and well-nourished. No distress.  HENT:  Head: Normocephalic and atraumatic.  Right Ear: Hearing, tympanic membrane, external ear and ear canal normal.  Left Ear: Hearing, tympanic membrane, external ear and ear canal normal.  Nose: Nose normal. No mucosal edema or rhinorrhea. Right sinus exhibits no maxillary sinus tenderness and no frontal sinus tenderness. Left sinus exhibits no maxillary sinus tenderness and no frontal sinus tenderness.  Mouth/Throat: Uvula is midline, oropharynx is clear and moist and mucous membranes are normal. No oropharyngeal exudate, posterior oropharyngeal edema, posterior oropharyngeal erythema or tonsillar abscesses.  Hoarse voice Nasal mucosal congestion  Eyes: Conjunctivae and EOM are normal. Pupils are  equal, round, and reactive to light. No scleral icterus.  Neck: Normal range of motion. Neck supple.  Cardiovascular: Normal rate, regular rhythm, normal heart sounds and intact distal pulses.   No murmur heard. Pulmonary/Chest: Effort normal and breath sounds normal. No respiratory distress. He has no wheezes. He has no rales.  Lymphadenopathy:    He has no cervical adenopathy.  Skin: Skin is warm and dry. No rash noted.  Nursing note and vitals reviewed.     Assessment & Plan:   Problem List Items Addressed This Visit    Hemoptysis - Primary    Isolated episode this morning, sounds small amt but more than  blood tinged. Anticipate burst capillary from intense cough episode. Anticipate due to head cold. Discussed this. rec supportive care, fluids, rest, will Rx cheratussin codeine cough syrup. He coughed in office without any further bleed. Baseline CXR today. Doubt TB, PE, cancer, other more ominous cause. He will let us know if ongoing or recurrent - or if bacterial sinusitis sxs develop. Pt agrees with plan, reassured.       Relevant Orders   DG Chest 2 View       Follow up plan: Return if symptoms worsen or fail to improve.  Ria Bush, MD

## 2017-05-30 DIAGNOSIS — D485 Neoplasm of uncertain behavior of skin: Secondary | ICD-10-CM | POA: Diagnosis not present

## 2017-05-30 DIAGNOSIS — Z85828 Personal history of other malignant neoplasm of skin: Secondary | ICD-10-CM | POA: Diagnosis not present

## 2017-05-30 DIAGNOSIS — Z1283 Encounter for screening for malignant neoplasm of skin: Secondary | ICD-10-CM | POA: Diagnosis not present

## 2018-06-03 DIAGNOSIS — Z1283 Encounter for screening for malignant neoplasm of skin: Secondary | ICD-10-CM | POA: Diagnosis not present

## 2018-06-03 DIAGNOSIS — Z85828 Personal history of other malignant neoplasm of skin: Secondary | ICD-10-CM | POA: Diagnosis not present

## 2018-06-03 DIAGNOSIS — D485 Neoplasm of uncertain behavior of skin: Secondary | ICD-10-CM | POA: Diagnosis not present

## 2018-06-25 DIAGNOSIS — H43392 Other vitreous opacities, left eye: Secondary | ICD-10-CM | POA: Diagnosis not present

## 2018-11-19 IMAGING — DX DG CHEST 2V
2 series · 2 of 2 positions shown · non-contrast
Comparison: Chest x-ray of August 12, 2010

CLINICAL DATA: Two days of cough cough and hemoptysis

EXAM:
CHEST  2 VIEW

[chest pa]
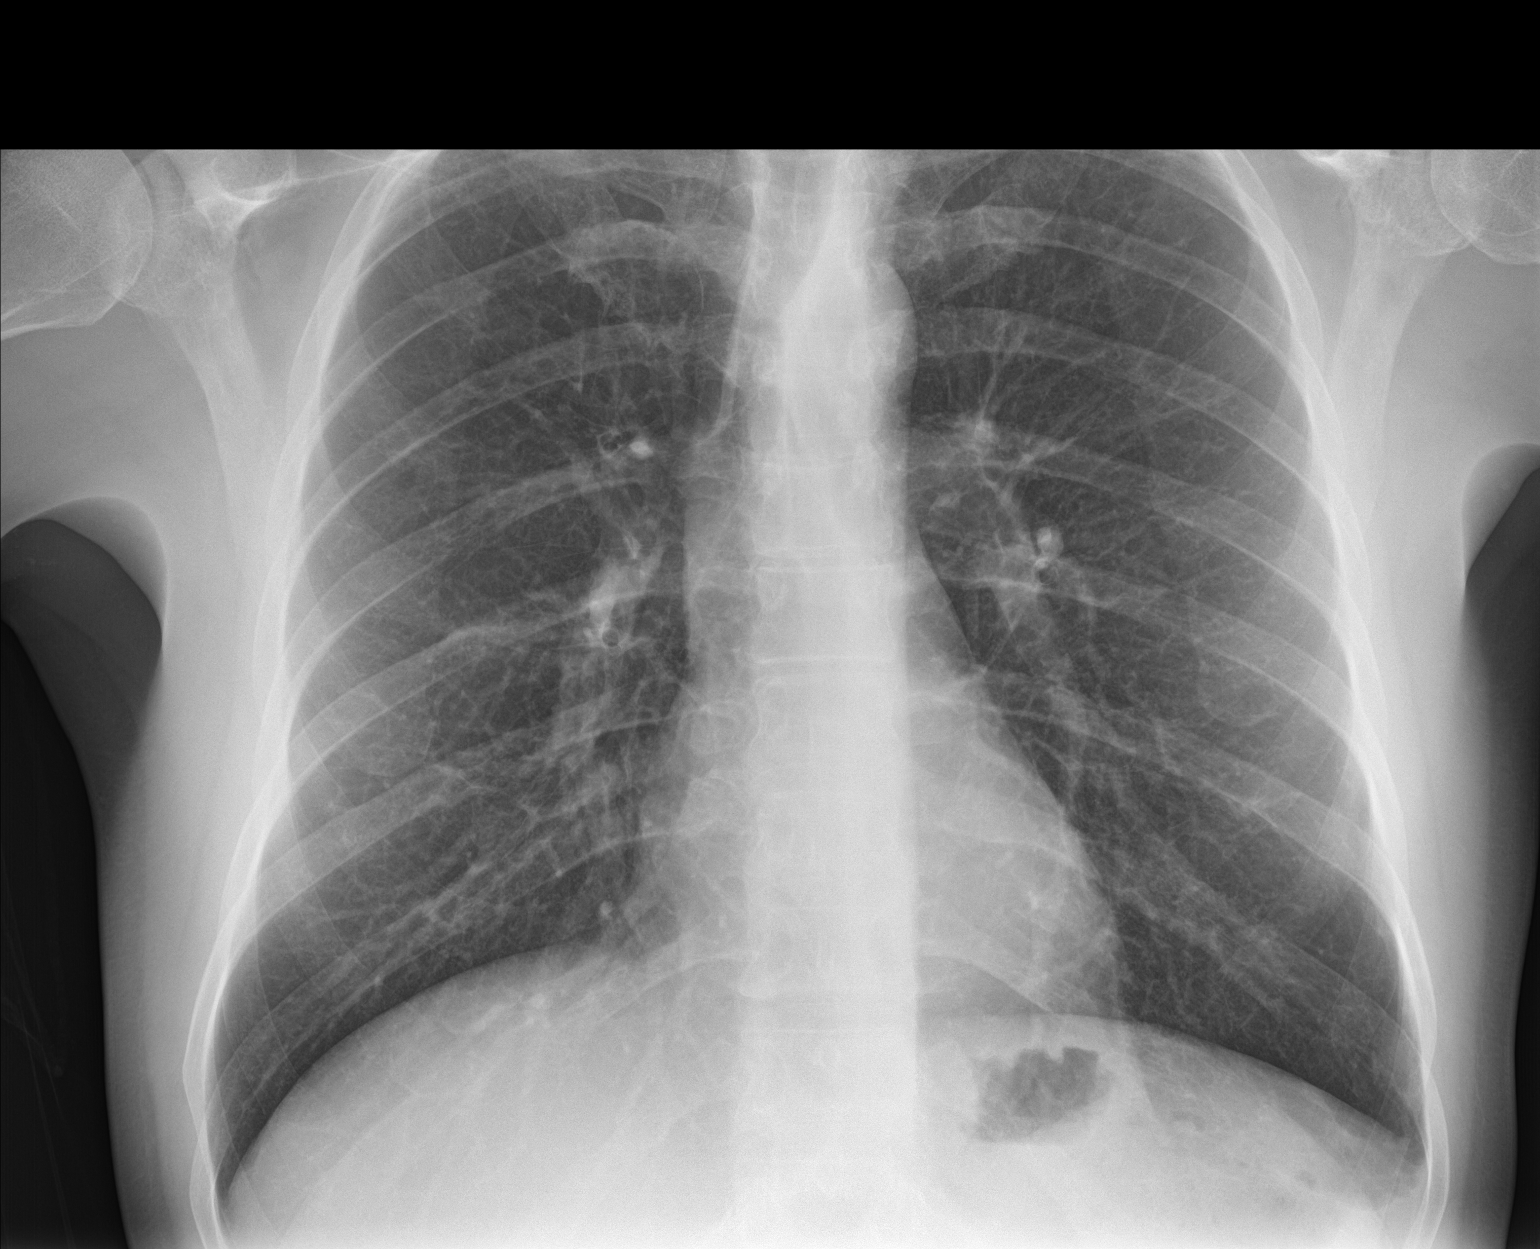

[chest lat]
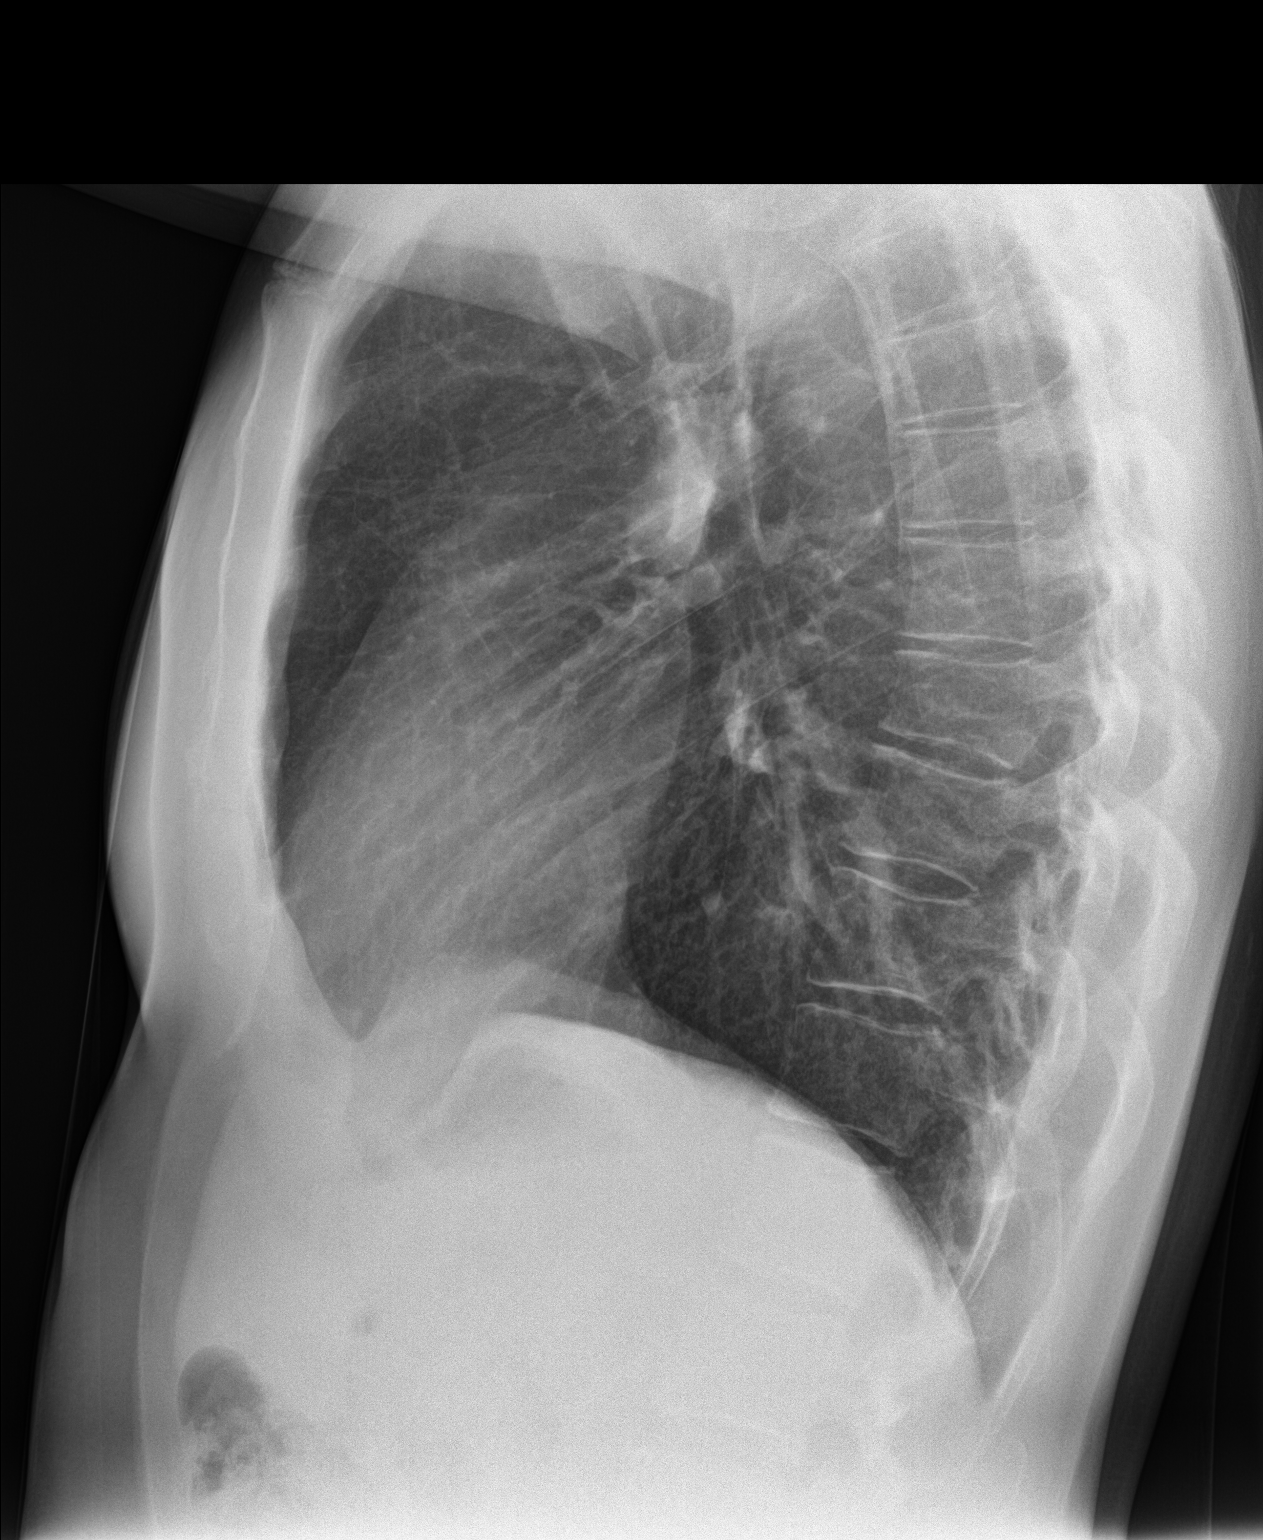

[2 of 2 positions shown; findings below may reference images not displayed]

FINDINGS: The lungs are well-expanded. There is mildly increased AP dimension
of the thorax. There is no focal infiltrate. There is no pleural
effusion or pneumothorax. The interstitial markings are coarse
bilaterally. The heart and pulmonary vascularity are normal. The
bony thorax is unremarkable.
IMPRESSION: Mild hyperinflation may reflect chronic bronchitic change or
reactive airway disease. The reported history of smoking is
ambiguous. If there is a history of smoking the findings could be
related to this as well. No alveolar pneumonia nor CHF.

## 2020-06-03 ENCOUNTER — Other Ambulatory Visit: Payer: Self-pay

## 2020-06-03 ENCOUNTER — Ambulatory Visit (INDEPENDENT_AMBULATORY_CARE_PROVIDER_SITE_OTHER): Payer: 59 | Admitting: Dermatology

## 2020-06-03 DIAGNOSIS — L570 Actinic keratosis: Secondary | ICD-10-CM

## 2020-06-03 DIAGNOSIS — L578 Other skin changes due to chronic exposure to nonionizing radiation: Secondary | ICD-10-CM

## 2020-06-03 DIAGNOSIS — D229 Melanocytic nevi, unspecified: Secondary | ICD-10-CM | POA: Diagnosis not present

## 2020-06-03 DIAGNOSIS — L814 Other melanin hyperpigmentation: Secondary | ICD-10-CM | POA: Diagnosis not present

## 2020-06-03 DIAGNOSIS — Z85828 Personal history of other malignant neoplasm of skin: Secondary | ICD-10-CM

## 2020-06-03 DIAGNOSIS — Z86018 Personal history of other benign neoplasm: Secondary | ICD-10-CM

## 2020-06-03 DIAGNOSIS — L821 Other seborrheic keratosis: Secondary | ICD-10-CM

## 2020-06-03 DIAGNOSIS — D225 Melanocytic nevi of trunk: Secondary | ICD-10-CM

## 2020-06-03 DIAGNOSIS — I781 Nevus, non-neoplastic: Secondary | ICD-10-CM | POA: Diagnosis not present

## 2020-06-03 DIAGNOSIS — Z1283 Encounter for screening for malignant neoplasm of skin: Secondary | ICD-10-CM

## 2020-06-03 DIAGNOSIS — D1800 Hemangioma unspecified site: Secondary | ICD-10-CM

## 2020-06-03 NOTE — Progress Notes (Signed)
Follow-Up Visit   Subjective  William Macias is a 44 y.o. male who presents for the following: Annual Exam (History of BCC and dysplastic nevi - TBSE today). The patient presents for Total-Body Skin Exam (TBSE) for skin cancer screening and mole check. Patient here for full body skin exam and skin cancer screening.  The following portions of the chart were reviewed this encounter and updated as appropriate:  Tobacco  Allergies  Meds  Problems  Med Hx  Surg Hx  Fam Hx     Review of Systems:  No other skin or systemic complaints except as noted in HPI or Assessment and Plan.  Objective  Well appearing patient in no apparent distress; mood and affect are within normal limits.  A full examination was performed including scalp, head, eyes, ears, nose, lips, neck, chest, axillae, abdomen, back, buttocks, bilateral upper extremities, bilateral lower extremities, hands, feet, fingers, toes, fingernails, and toenails. All findings within normal limits unless otherwise noted below.  Objective  Left Ear: Erythematous thin papules/macules with gritty scale.   Objective  Face: Dilated blood vessels   Objective  Left mid to low back 6.5 cm lat to spine, right ant waistline medial: Well healed biopsy sites with repigmentation   Assessment & Plan    Lentigines - Scattered tan macules - Discussed due to sun exposure - Benign, observe - Call for any changes  Seborrheic Keratoses - Stuck-on, waxy, tan-brown papules and plaques  - Discussed benign etiology and prognosis. - Observe - Call for any changes  Melanocytic Nevi - Tan-brown and/or pink-flesh-colored symmetric macules and papules - Benign appearing on exam today - Observation - Call clinic for new or changing moles - Recommend daily use of broad spectrum spf 30+ sunscreen to sun-exposed areas.   Hemangiomas - Red papules - Discussed benign nature - Observe - Call for any changes  Actinic Damage - Chronic,  secondary to cumulative UV/sun exposure - diffuse scaly erythematous macules with underlying dyspigmentation - Recommend daily broad spectrum sunscreen SPF 30+ to sun-exposed areas, reapply every 2 hours as needed.  - Call for new or changing lesions.  Skin cancer screening performed today.  History of Dysplastic Nevi - No evidence of recurrence today - Recommend regular full body skin exams - Recommend daily broad spectrum sunscreen SPF 30+ to sun-exposed areas, reapply every 2 hours as needed.  - Call if any new or changing lesions are noted between office visits  History of Basal Cell Carcinoma of the Skin - No evidence of recurrence today - Recommend regular full body skin exams - Recommend daily broad spectrum sunscreen SPF 30+ to sun-exposed areas, reapply every 2 hours as needed.  - Call if any new or changing lesions are noted between office visits  AK (actinic keratosis) Left Ear  Destruction of lesion - Left Ear Complexity: simple   Destruction method: cryotherapy   Informed consent: discussed and consent obtained   Timeout:  patient name, date of birth, surgical site, and procedure verified Lesion destroyed using liquid nitrogen: Yes   Region frozen until ice ball extended beyond lesion: Yes   Outcome: patient tolerated procedure well with no complications   Post-procedure details: wound care instructions given    Telangiectasia Face  Discussed BBL/laser treatment.  Nevus Left mid to low back 6.5 cm lat to spine, right ant waistline medial  Biopsy proven Melanocytic nevi with repigmentation - Benign.  Skin cancer screening  Return in about 1 year (around 06/03/2021) for TBSE.  I, Ashok Cordia, CMA, am acting as scribe for Sarina Ser, MD .  Documentation: I have reviewed the above documentation for accuracy and completeness, and I agree with the above.  Sarina Ser, MD

## 2020-06-03 NOTE — Patient Instructions (Signed)

## 2020-06-15 ENCOUNTER — Encounter: Payer: Self-pay | Admitting: Dermatology

## 2021-06-15 ENCOUNTER — Ambulatory Visit: Payer: 59 | Admitting: Dermatology

## 2021-06-15 ENCOUNTER — Other Ambulatory Visit: Payer: Self-pay

## 2021-06-15 DIAGNOSIS — L820 Inflamed seborrheic keratosis: Secondary | ICD-10-CM | POA: Diagnosis not present

## 2021-06-15 DIAGNOSIS — Z1283 Encounter for screening for malignant neoplasm of skin: Secondary | ICD-10-CM

## 2021-06-15 DIAGNOSIS — Z85828 Personal history of other malignant neoplasm of skin: Secondary | ICD-10-CM | POA: Diagnosis not present

## 2021-06-15 DIAGNOSIS — L219 Seborrheic dermatitis, unspecified: Secondary | ICD-10-CM

## 2021-06-15 DIAGNOSIS — Z86018 Personal history of other benign neoplasm: Secondary | ICD-10-CM

## 2021-06-15 DIAGNOSIS — D229 Melanocytic nevi, unspecified: Secondary | ICD-10-CM | POA: Diagnosis not present

## 2021-06-15 DIAGNOSIS — D1801 Hemangioma of skin and subcutaneous tissue: Secondary | ICD-10-CM

## 2021-06-15 DIAGNOSIS — L814 Other melanin hyperpigmentation: Secondary | ICD-10-CM

## 2021-06-15 DIAGNOSIS — L578 Other skin changes due to chronic exposure to nonionizing radiation: Secondary | ICD-10-CM

## 2021-06-15 DIAGNOSIS — L821 Other seborrheic keratosis: Secondary | ICD-10-CM

## 2021-06-15 MED ORDER — KETOCONAZOLE 2 % EX CREA: 1.0000 "application " | TOPICAL_CREAM | Freq: Two times a day (BID) | CUTANEOUS | 6 refills | Status: DC

## 2021-06-15 MED ORDER — KETOCONAZOLE 2 % EX CREA: TOPICAL_CREAM | CUTANEOUS | 6 refills | Status: DC

## 2021-06-15 NOTE — Progress Notes (Signed)
Follow-Up Visit   Subjective  William Macias is a 45 y.o. male who presents for the following: Annual Exam (Mole check ). Hx of BCC, hx of Dysplastic, pt has several spots that need to be checked today.  The patient presents for Total-Body Skin Exam (TBSE) for skin cancer screening and mole check.   The following portions of the chart were reviewed this encounter and updated as appropriate:   Tobacco  Allergies  Meds  Problems  Med Hx  Surg Hx  Fam Hx     Review of Systems:  No other skin or systemic complaints except as noted in HPI or Assessment and Plan.  Objective  Well appearing patient in no apparent distress; mood and affect are within normal limits.  A full examination was performed including scalp, head, eyes, ears, nose, lips, neck, chest, axillae, abdomen, back, buttocks, bilateral upper extremities, bilateral lower extremities, hands, feet, fingers, toes, fingernails, and toenails. All findings within normal limits unless otherwise noted below.  right infrapectoral Erythematous keratotic or waxy stuck-on papule or plaque.   mid chest Pink patches with greasy scale.   multiple Tan-brown and/or pink-flesh-colored symmetric macules and papules.                       Assessment & Plan  Inflamed seborrheic keratosis right infrapectoral  Reassured benign age-related growth.  Recommend observation.  Discussed cryotherapy if spot(s) become irritated or inflamed.   Prior to procedure, discussed risks of blister formation, small wound, skin dyspigmentation, or rare scar following cryotherapy. Recommend Vaseline ointment to treated areas while healing.   Destruction of lesion - right infrapectoral Complexity: simple   Destruction method: cryotherapy   Informed consent: discussed and consent obtained   Timeout:  patient name, date of birth, surgical site, and procedure verified Lesion destroyed using liquid nitrogen: Yes   Region frozen until ice  ball extended beyond lesion: Yes   Outcome: patient tolerated procedure well with no complications   Post-procedure details: wound care instructions given    Seborrheic dermatitis mid chest  Seborrheic Dermatitis  -  is a chronic persistent rash characterized by pinkness and scaling most commonly of the mid face but also can occur on the scalp (dandruff), ears; mid chest, mid back and groin.  It tends to be exacerbated by stress and cooler weather.  People who have neurologic disease may experience new onset or exacerbation of existing seborrheic dermatitis.  The condition is not curable but treatable and can be controlled.   Start Ketoconazole 2% cream apply to affected skin 3 days a week at bedtime   Related Medications ketoconazole (NIZORAL) 2 % cream Apply to rash on the chest 3 times a week at bedtime  Nevus multiple See Photos The patient will observe these symptoms, and report promptly any worsening or unexpected persistence.  If well, may return prn.   Skin cancer screening  Lentigines - Scattered tan macules - Due to sun exposure - Benign-appearing, observe - Recommend daily broad spectrum sunscreen SPF 30+ to sun-exposed areas, reapply every 2 hours as needed. - Call for any changes  Seborrheic Keratoses - Stuck-on, waxy, tan-brown papules and/or plaques  - Benign-appearing - Discussed benign etiology and prognosis. - Observe - Call for any changes  Melanocytic Nevi - Tan-brown and/or pink-flesh-colored symmetric macules and papules - Benign appearing on exam today - Observation - Call clinic for new or changing moles - Recommend daily use of broad spectrum spf 30+ sunscreen to  sun-exposed areas.   Hemangiomas - Red papules - Discussed benign nature - Observe - Call for any changes  Actinic Damage - Chronic condition, secondary to cumulative UV/sun exposure - diffuse scaly erythematous macules with underlying dyspigmentation - Recommend daily broad  spectrum sunscreen SPF 30+ to sun-exposed areas, reapply every 2 hours as needed.  - Staying in the shade or wearing long sleeves, sun glasses (UVA+UVB protection) and wide brim hats (4-inch brim around the entire circumference of the hat) are also recommended for sun protection.  - Call for new or changing lesions.  History of Dysplastic Nevi Multiple see history  - No evidence of recurrence today - Recommend regular full body skin exams - Recommend daily broad spectrum sunscreen SPF 30+ to sun-exposed areas, reapply every 2 hours as needed.  - Call if any new or changing lesions are noted between office visits   History of Basal Cell Carcinoma of the Skin Left medical infraclavicular 2016 - No evidence of recurrence today - Recommend regular full body skin exams - Recommend daily broad spectrum sunscreen SPF 30+ to sun-exposed areas, reapply every 2 hours as needed.  - Call if any new or changing lesions are noted between office visits  Skin cancer screening performed today.   Return in about 1 year (around 06/15/2022) for TBSE, Hx of Dysplastic nevus, hx of BCC .  IMarye Round, CMA, am acting as scribe for Sarina Ser, MD .  Documentation: I have reviewed the above documentation for accuracy and completeness, and I agree with the above.  Sarina Ser, MD

## 2021-06-15 NOTE — Patient Instructions (Addendum)
Prior to procedure, discussed risks of blister formation, small wound, skin dyspigmentation, or rare scar following cryotherapy. Recommend Vaseline ointment to treated areas while healing.     If You Need Anything After Your Visit  If you have any questions or concerns for your doctor, please call our main line at [phone] and press option 4 to reach your doctor's medical assistant. If no one answers, please leave a voicemail as directed and we will return your call as soon as possible. Messages left after 4 pm will be answered the following business day.   You may also send us a message via MyChart. We typically respond to MyChart messages within 1-2 business days.  For prescription refills, please ask your pharmacy to contact our office. Our fax number is [phone].  If you have an urgent issue when the clinic is closed that cannot wait until the next business day, you can page your doctor at the number below.    Please note that while we do our best to be available for urgent issues outside of office hours, we are not available 24/7.   If you have an urgent issue and are unable to reach us, you may choose to seek medical care at your doctor's office, retail clinic, urgent care center, or emergency room.  If you have a medical emergency, please immediately call 911 or go to the emergency department.  Pager Numbers  - Dr. Kowalski: [phone]  - Dr. Moye: [phone]  - Dr. Stewart: [phone]  In the event of inclement weather, please call our main line at [phone] for an update on the status of any delays or closures.  Dermatology Medication Tips: Please keep the boxes that topical medications come in in order to help keep track of the instructions about where and how to use these. Pharmacies typically print the medication instructions only on the boxes and not directly on the medication tubes.   If your medication is too expensive, please contact our office at  [phone] option 4 or send us a message through MyChart.   We are unable to tell what your co-pay for medications will be in advance as this is different depending on your insurance coverage. However, we may be able to find a substitute medication at lower cost or fill out paperwork to get insurance to cover a needed medication.   If a prior authorization is required to get your medication covered by your insurance company, please allow us 1-2 business days to complete this process.  Drug prices often vary depending on where the prescription is filled and some pharmacies may offer cheaper prices.  The website www.goodrx.com contains coupons for medications through different pharmacies. The prices here do not account for what the cost may be with help from insurance (it may be cheaper with your insurance), but the website can give you the price if you did not use any insurance.  - You can print the associated coupon and take it with your prescription to the pharmacy.  - You may also stop by our office during regular business hours and pick up a GoodRx coupon card.  - If you need your prescription sent electronically to a different pharmacy, notify our office through [facility] MyChart or by phone at [phone] option 4.     Si Usted Necesita Algo Despus de Su Visita  Tambin puede enviarnos un mensaje a travs de MyChart. Por lo general respondemos a los mensajes de MyChart en el transcurso de 1 a 2 das   hbiles.  Para renovar recetas, por favor pida a su farmacia que se ponga en contacto con nuestra oficina. Nuestro nmero de fax es el [phone].  Si tiene un asunto urgente cuando la clnica est cerrada y que no puede esperar hasta el siguiente da hbil, puede llamar/localizar a su doctor(a) al nmero que aparece a continuacin.   Por favor, tenga en cuenta que aunque hacemos todo lo posible para estar disponibles para asuntos urgentes fuera del horario de oficina, no estamos  disponibles las 24 horas del da, los 7 das de la semana.   Si tiene un problema urgente y no puede comunicarse con nosotros, puede optar por buscar atencin mdica  en el consultorio de su doctor(a), en una clnica privada, en un centro de atencin urgente o en una sala de emergencias.  Si tiene una emergencia mdica, por favor llame inmediatamente al 911 o vaya a la sala de emergencias.  Nmeros de bper  - Dr. Kowalski: [phone]  - Dra. Moye: [phone]  - Dra. Stewart: [phone]  En caso de inclemencias del tiempo, por favor llame a nuestra lnea principal al [phone] para una actualizacin sobre el estado de cualquier retraso o cierre.  Consejos para la medicacin en dermatologa: Por favor, guarde las cajas en las que vienen los medicamentos de uso tpico para ayudarle a seguir las instrucciones sobre dnde y cmo usarlos. Las farmacias generalmente imprimen las instrucciones del medicamento slo en las cajas y no directamente en los tubos del medicamento.   Si su medicamento es muy caro, por favor, pngase en contacto con nuestra oficina llamando al [phone] y presione la opcin 4 o envenos un mensaje a travs de MyChart.   No podemos decirle cul ser su copago por los medicamentos por adelantado ya que esto es diferente dependiendo de la cobertura de su seguro. Sin embargo, es posible que podamos encontrar un medicamento sustituto a menor costo o llenar un formulario para que el seguro cubra el medicamento que se considera necesario.   Si se requiere una autorizacin previa para que su compaa de seguros cubra su medicamento, por favor permtanos de 1 a 2 das hbiles para completar este proceso.  Los precios de los medicamentos varan con frecuencia dependiendo del lugar de dnde se surte la receta y alguna farmacias pueden ofrecer precios ms baratos.  El sitio web www.goodrx.com tiene cupones para medicamentos de diferentes farmacias. Los precios aqu no  tienen en cuenta lo que podra costar con la ayuda del seguro (puede ser ms barato con su seguro), pero el sitio web puede darle el precio si no utiliz ningn seguro.  - Puede imprimir el cupn correspondiente y llevarlo con su receta a la farmacia.  - Tambin puede pasar por nuestra oficina durante el horario de atencin regular y recoger una tarjeta de cupones de GoodRx.  - Si necesita que su receta se enve electrnicamente a una farmacia diferente, informe a nuestra oficina a travs de MyChart de [facility] o por telfono llamando al [phone] y presione la opcin 4.  

## 2021-06-22 ENCOUNTER — Encounter: Payer: Self-pay | Admitting: Dermatology

## 2022-06-15 ENCOUNTER — Ambulatory Visit: Payer: 59 | Admitting: Dermatology

## 2022-06-15 VITALS — BP 124/80 | HR 76

## 2022-06-15 DIAGNOSIS — D229 Melanocytic nevi, unspecified: Secondary | ICD-10-CM

## 2022-06-15 DIAGNOSIS — Z1283 Encounter for screening for malignant neoplasm of skin: Secondary | ICD-10-CM

## 2022-06-15 DIAGNOSIS — Z86018 Personal history of other benign neoplasm: Secondary | ICD-10-CM

## 2022-06-15 DIAGNOSIS — L821 Other seborrheic keratosis: Secondary | ICD-10-CM

## 2022-06-15 DIAGNOSIS — L219 Seborrheic dermatitis, unspecified: Secondary | ICD-10-CM | POA: Diagnosis not present

## 2022-06-15 DIAGNOSIS — Z79899 Other long term (current) drug therapy: Secondary | ICD-10-CM

## 2022-06-15 DIAGNOSIS — L814 Other melanin hyperpigmentation: Secondary | ICD-10-CM

## 2022-06-15 DIAGNOSIS — L578 Other skin changes due to chronic exposure to nonionizing radiation: Secondary | ICD-10-CM

## 2022-06-15 NOTE — Patient Instructions (Addendum)
Gentle Skin Care Guide  1. Bathe no more than once a day.  2. Avoid bathing in hot water  3. Use a mild soap like Dove, Vanicream, Cetaphil, CeraVe. Can use Lever 2000 or Cetaphil antibacterial soap  4. Use soap only where you need it. On most days, use it under your arms, between your legs, and on your feet. Let the water rinse other areas unless visibly dirty.  5. When you get out of the bath/shower, use a towel to gently blot your skin dry, don't rub it.  6. While your skin is still a little damp, apply a moisturizing cream such as Vanicream, CeraVe, Cetaphil, Eucerin, Sarna lotion or plain Vaseline Jelly. For hands apply Neutrogena Norwegian Hand Cream or Excipial Hand Cream.  7. Reapply moisturizer any time you start to itch or feel dry.  8. Sometimes using free and clear laundry detergents can be helpful. Fabric softener sheets should be avoided. Downy Free & Gentle liquid, or any liquid fabric softener that is free of dyes and perfumes, it acceptable to use  9. If your doctor has given you prescription creams you may apply moisturizers over them     Melanoma ABCDEs  Melanoma is the most dangerous type of skin cancer, and is the leading cause of death from skin disease.  You are more likely to develop melanoma if you: Have light-colored skin, light-colored eyes, or red or blond hair Spend a lot of time in the sun Tan regularly, either outdoors or in a tanning bed Have had blistering sunburns, especially during childhood Have a close family member who has had a melanoma Have atypical moles or large birthmarks  Early detection of melanoma is key since treatment is typically straightforward and cure rates are extremely high if we catch it early.   The first sign of melanoma is often a change in a mole or a new dark spot.  The ABCDE system is a way of remembering the signs of melanoma.  A for asymmetry:  The two halves do not match. B for border:  The edges of the growth are  irregular. C for color:  A mixture of colors are present instead of an even brown color. D for diameter:  Melanomas are usually (but not always) greater than 6mm - the size of a pencil eraser. E for evolution:  The spot keeps changing in size, shape, and color.  Please check your skin once per month between visits. You can use a small mirror in front and a large mirror behind you to keep an eye on the back side or your body.   If you see any new or changing lesions before your next follow-up, please call to schedule a visit.  Please continue daily skin protection including broad spectrum sunscreen SPF 30+ to sun-exposed areas, reapplying every 2 hours as needed when you're outdoors.   Staying in the shade or wearing long sleeves, sun glasses (UVA+UVB protection) and wide brim hats (4-inch brim around the entire circumference of the hat) are also recommended for sun protection.    Due to recent changes in healthcare laws, you may see results of your pathology and/or laboratory studies on MyChart before the doctors have had a chance to review them. We understand that in some cases there may be results that are confusing or concerning to you. Please understand that not all results are received at the same time and often the doctors may need to interpret multiple results in order to provide you with   the best plan of care or course of treatment. Therefore, we ask that you please give us 2 business days to thoroughly review all your results before contacting the office for clarification. Should we see a critical lab result, you will be contacted sooner.   If You Need Anything After Your Visit  If you have any questions or concerns for your doctor, please call our main line at [phone] and press option 4 to reach your doctor's medical assistant. If no one answers, please leave a voicemail as directed and we will return your call as soon as possible. Messages left after 4 pm will be answered the  following business day.   You may also send us a message via MyChart. We typically respond to MyChart messages within 1-2 business days.  For prescription refills, please ask your pharmacy to contact our office. Our fax number is [phone].  If you have an urgent issue when the clinic is closed that cannot wait until the next business day, you can page your doctor at the number below.    Please note that while we do our best to be available for urgent issues outside of office hours, we are not available 24/7.   If you have an urgent issue and are unable to reach us, you may choose to seek medical care at your doctor's office, retail clinic, urgent care center, or emergency room.  If you have a medical emergency, please immediately call 911 or go to the emergency department.  Pager Numbers  - Dr. Kowalski: [phone]  - Dr. Moye: [phone]  - Dr. Stewart: [phone]  In the event of inclement weather, please call our main line at [phone] for an update on the status of any delays or closures.  Dermatology Medication Tips: Please keep the boxes that topical medications come in in order to help keep track of the instructions about where and how to use these. Pharmacies typically print the medication instructions only on the boxes and not directly on the medication tubes.   If your medication is too expensive, please contact our office at [phone] option 4 or send us a message through MyChart.   We are unable to tell what your co-pay for medications will be in advance as this is different depending on your insurance coverage. However, we may be able to find a substitute medication at lower cost or fill out paperwork to get insurance to cover a needed medication.   If a prior authorization is required to get your medication covered by your insurance company, please allow us 1-2 business days to complete this process.  Drug prices often vary depending on where the  prescription is filled and some pharmacies may offer cheaper prices.  The website www.goodrx.com contains coupons for medications through different pharmacies. The prices here do not account for what the cost may be with help from insurance (it may be cheaper with your insurance), but the website can give you the price if you did not use any insurance.  - You can print the associated coupon and take it with your prescription to the pharmacy.  - You may also stop by our office during regular business hours and pick up a GoodRx coupon card.  - If you need your prescription sent electronically to a different pharmacy, notify our office through [facility] MyChart or by phone at [phone] option 4.     Si Usted Necesita Algo Despus de Su Visita  Tambin puede enviarnos un mensaje a travs de   MyChart. Por lo general respondemos a los mensajes de MyChart en el transcurso de 1 a 2 das hbiles.  Para renovar recetas, por favor pida a su farmacia que se ponga en contacto con nuestra oficina. Nuestro nmero de fax es el [phone].  Si tiene un asunto urgente cuando la clnica est cerrada y que no puede esperar hasta el siguiente da hbil, puede llamar/localizar a su doctor(a) al nmero que aparece a continuacin.   Por favor, tenga en cuenta que aunque hacemos todo lo posible para estar disponibles para asuntos urgentes fuera del horario de oficina, no estamos disponibles las 24 horas del da, los 7 das de la semana.   Si tiene un problema urgente y no puede comunicarse con nosotros, puede optar por buscar atencin mdica  en el consultorio de su doctor(a), en una clnica privada, en un centro de atencin urgente o en una sala de emergencias.  Si tiene una emergencia mdica, por favor llame inmediatamente al 911 o vaya a la sala de emergencias.  Nmeros de bper  - Dr. Kowalski: [phone]  - Dra. Moye: [phone]  - Dra. Stewart: [phone]  En caso de inclemencias del tiempo,  por favor llame a nuestra lnea principal al [phone] para una actualizacin sobre el estado de cualquier retraso o cierre.  Consejos para la medicacin en dermatologa: Por favor, guarde las cajas en las que vienen los medicamentos de uso tpico para ayudarle a seguir las instrucciones sobre dnde y cmo usarlos. Las farmacias generalmente imprimen las instrucciones del medicamento slo en las cajas y no directamente en los tubos del medicamento.   Si su medicamento es muy caro, por favor, pngase en contacto con nuestra oficina llamando al [phone] y presione la opcin 4 o envenos un mensaje a travs de MyChart.   No podemos decirle cul ser su copago por los medicamentos por adelantado ya que esto es diferente dependiendo de la cobertura de su seguro. Sin embargo, es posible que podamos encontrar un medicamento sustituto a menor costo o llenar un formulario para que el seguro cubra el medicamento que se considera necesario.   Si se requiere una autorizacin previa para que su compaa de seguros cubra su medicamento, por favor permtanos de 1 a 2 das hbiles para completar este proceso.  Los precios de los medicamentos varan con frecuencia dependiendo del lugar de dnde se surte la receta y alguna farmacias pueden ofrecer precios ms baratos.  El sitio web www.goodrx.com tiene cupones para medicamentos de diferentes farmacias. Los precios aqu no tienen en cuenta lo que podra costar con la ayuda del seguro (puede ser ms barato con su seguro), pero el sitio web puede darle el precio si no utiliz ningn seguro.  - Puede imprimir el cupn correspondiente y llevarlo con su receta a la farmacia.  - Tambin puede pasar por nuestra oficina durante el horario de atencin regular y recoger una tarjeta de cupones de GoodRx.  - Si necesita que su receta se enve electrnicamente a una farmacia diferente, informe a nuestra oficina a travs de MyChart de [facility] o por telfono llamando al  [phone] y presione la opcin 4.  

## 2022-06-15 NOTE — Progress Notes (Signed)
Follow-Up Visit   Subjective  William Macias is a 46 y.o. male who presents for the following: Annual Exam (Tbse, hx of dyplastic nevus, hx of bcc). The patient presents for Total-Body Skin Exam (TBSE) for skin cancer screening and mole check.  The patient has spots, moles and lesions to be evaluated, some may be new or changing and the patient has concerns that these could be cancer.  The following portions of the chart were reviewed this encounter and updated as appropriate:  Tobacco  Allergies  Meds  Problems  Med Hx  Surg Hx  Fam Hx     Review of Systems: No other skin or systemic complaints except as noted in HPI or Assessment and Plan.  Objective  Well appearing patient in no apparent distress; mood and affect are within normal limits.  A full examination was performed including scalp, head, eyes, ears, nose, lips, neck, chest, axillae, abdomen, back, buttocks, bilateral upper extremities, bilateral lower extremities, hands, feet, fingers, toes, fingernails, and toenails. All findings within normal limits unless otherwise noted below.  Chest - Medial (Center) Pink patches with greasy scale.   back and chest Multiple moles  Right 2nd Toe 0.5 x 0.4 cm brown macule                             Assessment & Plan  Seborrheic dermatitis Chest - Medial (Center) Seborrheic Dermatitis  -  is a chronic persistent rash characterized by pinkness and scaling most commonly of the mid face but also can occur on the scalp (dandruff), ears; mid chest, mid back and groin.  It tends to be exacerbated by stress and cooler weather.  People who have neurologic disease may experience new onset or exacerbation of existing seborrheic dermatitis.  The condition is not curable but treatable and can be controlled.    Re-start Ketoconazole 2% cream apply to affected skin 3 days a week at bedtime   Related Medications ketoconazole (NIZORAL) 2 % cream Apply to rash on  the chest 3 times a week at bedtime  Nevus (2) back and chest; Right 2nd Toe See photos Benign-appearing. Stable compared to previous visit. Observation.  Call clinic for new or changing moles.  Recommend daily use of broad spectrum spf 30+ sunscreen to sun-exposed areas.   Lentigines - Scattered tan macules - Due to sun exposure - Benign-appearing, observe - Recommend daily broad spectrum sunscreen SPF 30+ to sun-exposed areas, reapply every 2 hours as needed. - Call for any changes  Seborrheic Keratoses - Stuck-on, waxy, tan-brown papules and/or plaques  - Benign-appearing - Discussed benign etiology and prognosis. - Observe - Call for any changes  Melanocytic Nevi - Tan-brown and/or pink-flesh-colored symmetric macules and papules - Benign appearing on exam today - Observation - Call clinic for new or changing moles - Recommend daily use of broad spectrum spf 30+ sunscreen to sun-exposed areas.   Xerosis - diffuse xerotic patches - recommend gentle, hydrating skin care - gentle skin care handout given  Hemangiomas - Red papules - Discussed benign nature - Observe - Call for any changes  Actinic Damage - Chronic condition, secondary to cumulative UV/sun exposure - diffuse scaly erythematous macules with underlying dyspigmentation - Recommend daily broad spectrum sunscreen SPF 30+ to sun-exposed areas, reapply every 2 hours as needed.  - Staying in the shade or wearing long sleeves, sun glasses (UVA+UVB protection) and wide brim hats (4-inch brim around the entire circumference  of the hat) are also recommended for sun protection.  - Call for new or changing lesions.  History of Dysplastic Nevi Multiple sites see history - No evidence of recurrence today - Recommend regular full body skin exams - Recommend daily broad spectrum sunscreen SPF 30+ to sun-exposed areas, reapply every 2 hours as needed.  - Call if any new or changing lesions are noted between office  visits  History of Basal Cell Carcinoma of the Skin Left medial infraclavicular 2016 - No evidence of recurrence today - Recommend regular full body skin exams - Recommend daily broad spectrum sunscreen SPF 30+ to sun-exposed areas, reapply every 2 hours as needed.  - Call if any new or changing lesions are noted between office visits  Skin cancer screening performed today. Return in about 1 year (around 06/16/2023) for TBSE.  IRuthell Rummage, CMA, am acting as scribe for Sarina Ser, MD. Documentation: I have reviewed the above documentation for accuracy and completeness, and I agree with the above.  Sarina Ser, MD

## 2022-06-29 ENCOUNTER — Encounter: Payer: Self-pay | Admitting: Dermatology

## 2023-06-20 ENCOUNTER — Ambulatory Visit: Payer: 59 | Admitting: Dermatology

## 2023-09-27 ENCOUNTER — Ambulatory Visit: Payer: 59 | Admitting: Dermatology

## 2023-09-27 ENCOUNTER — Encounter: Payer: Self-pay | Admitting: Dermatology

## 2023-09-27 DIAGNOSIS — Z85828 Personal history of other malignant neoplasm of skin: Secondary | ICD-10-CM

## 2023-09-27 DIAGNOSIS — D492 Neoplasm of unspecified behavior of bone, soft tissue, and skin: Secondary | ICD-10-CM

## 2023-09-27 DIAGNOSIS — L821 Other seborrheic keratosis: Secondary | ICD-10-CM | POA: Diagnosis not present

## 2023-09-27 DIAGNOSIS — Z1283 Encounter for screening for malignant neoplasm of skin: Secondary | ICD-10-CM | POA: Diagnosis not present

## 2023-09-27 DIAGNOSIS — Z79899 Other long term (current) drug therapy: Secondary | ICD-10-CM

## 2023-09-27 DIAGNOSIS — L219 Seborrheic dermatitis, unspecified: Secondary | ICD-10-CM | POA: Diagnosis not present

## 2023-09-27 DIAGNOSIS — L578 Other skin changes due to chronic exposure to nonionizing radiation: Secondary | ICD-10-CM | POA: Diagnosis not present

## 2023-09-27 DIAGNOSIS — I781 Nevus, non-neoplastic: Secondary | ICD-10-CM

## 2023-09-27 DIAGNOSIS — L814 Other melanin hyperpigmentation: Secondary | ICD-10-CM

## 2023-09-27 DIAGNOSIS — D2271 Melanocytic nevi of right lower limb, including hip: Secondary | ICD-10-CM

## 2023-09-27 DIAGNOSIS — D229 Melanocytic nevi, unspecified: Secondary | ICD-10-CM

## 2023-09-27 DIAGNOSIS — D489 Neoplasm of uncertain behavior, unspecified: Secondary | ICD-10-CM

## 2023-09-27 DIAGNOSIS — L738 Other specified follicular disorders: Secondary | ICD-10-CM

## 2023-09-27 DIAGNOSIS — W908XXA Exposure to other nonionizing radiation, initial encounter: Secondary | ICD-10-CM

## 2023-09-27 DIAGNOSIS — Z7189 Other specified counseling: Secondary | ICD-10-CM

## 2023-09-27 MED ORDER — KETOCONAZOLE 2 % EX CREA: TOPICAL_CREAM | CUTANEOUS | 11 refills | Status: AC

## 2023-09-27 MED ORDER — HYDROCORTISONE 2.5 % EX CREA: TOPICAL_CREAM | CUTANEOUS | 11 refills | Status: AC

## 2023-09-27 NOTE — Patient Instructions (Addendum)

## 2023-09-27 NOTE — Progress Notes (Signed)
 Follow-Up Visit   Subjective  William Macias is a 48 y.o. male who presents for the following: Skin Cancer Screening and Full Body Skin Exam Slight bump at left cheek under eye  Hx of dysplastic nevis  Hx of bcc The patient presents for Total-Body Skin Exam (TBSE) for skin cancer screening and mole check. The patient has spots, moles and lesions to be evaluated, some may be new or changing and the patient may have concern these could be cancer.  The following portions of the chart were reviewed this encounter and updated as appropriate: medications, allergies, medical history  Review of Systems:  No other skin or systemic complaints except as noted in HPI or Assessment and Plan.  Objective  Well appearing patient in no apparent distress; mood and affect are within normal limits.  A full examination was performed including scalp, head, eyes, ears, nose, lips, neck, chest, axillae, abdomen, back, buttocks, bilateral upper extremities, bilateral lower extremities, hands, feet, fingers, toes, fingernails, and toenails. All findings within normal limits unless otherwise noted below.   Relevant physical exam findings are noted in the Assessment and Plan.  right anterior lateral neck 0.6 cm irregular dark brown macule          Assessment & Plan   SKIN CANCER SCREENING PERFORMED TODAY.  ACTINIC DAMAGE - Chronic condition, secondary to cumulative UV/sun exposure - diffuse scaly erythematous macules with underlying dyspigmentation - Recommend daily broad spectrum sunscreen SPF 30+ to sun-exposed areas, reapply every 2 hours as needed.  - Staying in the shade or wearing long sleeves, sun glasses (UVA+UVB protection) and wide brim hats (4-inch brim around the entire circumference of the hat) are also recommended for sun protection.  - Call for new or changing lesions.  Sebaceous Hyperplasia At left cheek area  - Small yellow papules with a central dell - Benign-appearing -  Observe. Call for changes.  LENTIGINES, SEBORRHEIC KERATOSES, HEMANGIOMAS - Benign normal skin lesions - Benign-appearing - Call for any changes  MELANOCYTIC NEVI - Tan-brown and/or pink-flesh-colored symmetric macules and papules - Benign appearing on exam today - Observation - Call clinic for new or changing moles - Recommend daily use of broad spectrum spf 30+ sunscreen to sun-exposed areas.   Nevus (2) Right 2nd Toe 0.5 x 0.4 cm brown macule see new photos today And multiple moles and chest and back photos from 11/30 Benign-appearing. Stable compared to previous visit. Observation.  Call clinic for new or changing moles.  Recommend daily use of broad spectrum spf 30+ sunscreen to sun-exposed areas.   TELANGIECTASIA Exam: dilated blood vessel(s) around nose Treatment Plan: Benign appearing on exam Call for changes Counseling for BBL / IPL / Laser and Coordination of Care Discussed the treatment option of Broad Band Light (BBL) /Intense Pulsed Light (IPL)/ Laser for skin discoloration, including brown spots and redness.  Typically we recommend at least 1-3 treatment sessions about 5-8 weeks apart for best results.  Cannot have tanned skin when BBL performed, and regular use of sunscreen/photoprotection is advised after the procedure to help maintain results. The patient's condition may also require "maintenance treatments" in the future.  The fee for BBL / laser treatments is $350 per treatment session for the whole face.  A fee can be quoted for other parts of the body.  Insurance typically does not pay for BBL/laser treatments and therefore the fee is an out-of-pocket cost. Recommend prophylactic valtrex treatment. Once scheduled for procedure, will send Rx in prior to patient's appointment.  Seborrheic dermatitis Chest - Medial (Center) Exam: Pink patches with greasy scale. At chest  Chronic and persistent condition with duration or expected duration over one year. Condition is  improving with treatment but not currently at goal. Seborrheic Dermatitis  -  is a chronic persistent rash characterized by pinkness and scaling most commonly of the mid face but also can occur on the scalp (dandruff), ears; mid chest, mid back and groin.  It tends to be exacerbated by stress and cooler weather.  People who have neurologic disease may experience new onset or exacerbation of existing seborrheic dermatitis.  The condition is not curable but treatable and can be controlled.  Continue Ketoconazole 2% cream apply to affected skin 3 days a week  M,W,F at bedtime Start Hydrocortisone 2.5 % cream apply to affected skin 3 days a week T,Th,Sat at bedtime.   HISTORY OF BASAL CELL CARCINOMA OF THE SKIN Left medial infraclavicular - Excised 05/25/2015 Margins free.  - No evidence of recurrence today - Recommend regular full body skin exams - Recommend daily broad spectrum sunscreen SPF 30+ to sun-exposed areas, reapply every 2 hours as needed.  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 DN within scar, recurrent.  No evidence of recurrence today Recommend regular full body skin exams Recommend daily broad spectrum sunscreen SPF 30+ to sun-exposed areas, reapply every 2 hours as needed.  Call if any new or changing lesions are noted between office visits  NEOPLASM OF UNCERTAIN BEHAVIOR right anterior lateral neck Epidermal / dermal shaving  Lesion diameter (cm):  0.6 Informed consent: discussed and consent obtained   Timeout: patient name, date of birth, surgical site, and procedure verified   Procedure prep:  Patient was prepped and draped in usual sterile fashion Prep type:  Isopropyl alcohol Anesthesia: the lesion was  anesthetized in a standard fashion   Anesthetic:  1% lidocaine w/ epinephrine 1-100,000 buffered w/ 8.4% NaHCO3 Instrument used: flexible razor blade   Hemostasis achieved with: pressure, aluminum chloride and electrodesiccation   Outcome: patient tolerated procedure well   Post-procedure details: sterile dressing applied and wound care instructions given   Dressing type: bandage and petrolatum   Specimen 1 - Surgical pathology Differential Diagnosis: irregular nevus r/o dysplasia   Check Margins: No Irregular nevus r/o dysplasia  SEBORRHEIC DERMATITIS   Related Medications ketoconazole (NIZORAL) 2 % cream Apply to rash on the chest 3 times a week at bedtime on m-w-f hydrocortisone 2.5 % cream Apply to rash on chest 3 times a week at bedtime on Tue- thur- Sat Return in about 1 year (around 09/26/2024) for TBSE.  IAsher Muir, CMA, am acting as scribe for Armida Sans, MD.   Documentation: I have reviewed the above documentation for accuracy and completeness, and I agree with the above.  Armida Sans, MD

## 2023-10-01 ENCOUNTER — Telehealth: Payer: Self-pay

## 2023-10-01 LAB — SURGICAL PATHOLOGY

## 2023-10-01 NOTE — Telephone Encounter (Signed)
 Patient informed of pathology results

## 2023-10-01 NOTE — Telephone Encounter (Signed)
-----   Message from Armida Sans sent at 10/01/2023  5:43 PM EDT ----- FINAL DIAGNOSIS        1. Skin, right anterior lateral neck :       PIGMENTED SEBORRHEIC KERATOSIS   Benign keratosis No further treatment needed

## 2023-10-02 ENCOUNTER — Telehealth: Payer: Self-pay

## 2023-10-02 NOTE — Telephone Encounter (Signed)
 Tried calling patient , per Dr. Philemon Kingdom request No answer. LM for patient to return my call.

## 2023-10-02 NOTE — Telephone Encounter (Signed)
 Contacted patient again at the request of Dr. Gwen Pounds, see note below  Please contact pt and advise that after Dr Kirtland Bouchard reviewed photos again of toe, he feels we should plan to remove the mole on the toe. He would not recommend waiting until next year to re-check. He would recommend coming in for shave removal within the next 6 mos.   Discussed recommendations and scheduled patient  to come back for shave removal on April 15 at 8:30 am.

## 2023-10-30 ENCOUNTER — Encounter: Payer: Self-pay | Admitting: Dermatology

## 2023-10-30 ENCOUNTER — Ambulatory Visit: Admitting: Dermatology

## 2023-10-30 DIAGNOSIS — D492 Neoplasm of unspecified behavior of bone, soft tissue, and skin: Secondary | ICD-10-CM

## 2023-10-30 DIAGNOSIS — D2271 Melanocytic nevi of right lower limb, including hip: Secondary | ICD-10-CM

## 2023-10-30 NOTE — Progress Notes (Signed)
   Follow-Up Visit   Subjective  William Macias is a 48 y.o. male who presents for the following: recheck nevus R 2nd toe The patient has spots, moles and lesions to be evaluated, some may be new or changing and the patient may have concern these could be cancer.  The following portions of the chart were reviewed this encounter and updated as appropriate: medications, allergies, medical history  Review of Systems:  No other skin or systemic complaints except as noted in HPI or Assessment and Plan.  Objective  Well appearing patient in no apparent distress; mood and affect are within normal limits.  A focused examination was performed of the following areas: Right foot  Relevant exam findings are noted in the Assessment and Plan.  R 2nd toe 0.6cm irregular brown macule   Assessment & Plan   NEOPLASM OF SKIN R 2nd toe Epidermal / dermal shaving  Lesion diameter (cm):  0.6 Informed consent: discussed and consent obtained   Timeout: patient name, date of birth, surgical site, and procedure verified   Procedure prep:  Patient was prepped and draped in usual sterile fashion Prep type:  Isopropyl alcohol Anesthesia: the lesion was anesthetized in a standard fashion   Anesthetic:  1% lidocaine w/ epinephrine 1-100,000 buffered w/ 8.4% NaHCO3 Instrument used: DermaBlade   Hemostasis achieved with: pressure, aluminum chloride and electrodesiccation   Outcome: patient tolerated procedure well   Post-procedure details: sterile dressing applied and wound care instructions given   Dressing type: bandage and bacitracin   Specimen 1 - Surgical pathology Differential Diagnosis: D48.5 Nevus vs Dysplastic Nevus  Check Margins: yes 0.6cm irregular brown macule  Return for as scheduled.  I, Rollie Clipper, RMA, am acting as scribe for Celine Collard, MD .   Documentation: I have reviewed the above documentation for accuracy and completeness, and I agree with the above.  Celine Collard, MD

## 2023-10-30 NOTE — Patient Instructions (Signed)

## 2023-11-06 LAB — SURGICAL PATHOLOGY

## 2023-11-07 ENCOUNTER — Telehealth: Payer: Self-pay

## 2023-11-07 DIAGNOSIS — D239 Other benign neoplasm of skin, unspecified: Secondary | ICD-10-CM

## 2023-11-07 NOTE — Telephone Encounter (Signed)
-----   Message from Celine Collard sent at 11/07/2023 11:05 AM EDT ----- FINAL DIAGNOSIS        1. Skin, R 2nd toe :       DYSPLASTIC NEVUS WITH MODERATE TO SEVERE ATYPIA, PERIPHERAL AND DEEP MARGINS       INVOLVED, SEE DESCRIPTION   Moderate to severe dysplastic Schedule surgery (Schedule with Dr Fain Home since I am currently not doing surgery since my spinal neck surgery) Keep follow up appt

## 2023-11-07 NOTE — Telephone Encounter (Signed)
 Advised pt of bx results.  Discussed treatment recommendation and referral to Dr. Paci.  Advised pt to keep f/u appointment with Dr. Bary Likes.  Referral placed to Dr. Boyce Byes

## 2023-12-03 ENCOUNTER — Encounter: Payer: Self-pay | Admitting: Dermatology

## 2023-12-05 ENCOUNTER — Ambulatory Visit: Admitting: Dermatology

## 2023-12-05 ENCOUNTER — Encounter: Payer: Self-pay | Admitting: Dermatology

## 2023-12-05 VITALS — BP 128/88 | HR 78 | Temp 98.4°F

## 2023-12-05 DIAGNOSIS — D239 Other benign neoplasm of skin, unspecified: Secondary | ICD-10-CM

## 2023-12-05 DIAGNOSIS — D2371 Other benign neoplasm of skin of right lower limb, including hip: Secondary | ICD-10-CM

## 2023-12-05 MED ORDER — MUPIROCIN 2 % EX OINT: 1.0000 | TOPICAL_OINTMENT | Freq: Two times a day (BID) | CUTANEOUS | 2 refills | Status: AC

## 2023-12-05 NOTE — Patient Instructions (Addendum)
 Important Information  Due to recent changes in healthcare laws, you may see results of your pathology and/or laboratory studies on MyChart before the doctors have had a chance to review them. We understand that in some cases there may be results that are confusing or concerning to you. Please understand that not all results are received at the same time and often the doctors may need to interpret multiple results in order to provide you with the best plan of care or course of treatment. Therefore, we ask that you please give us  2 business days to thoroughly review all your results before contacting the office for clarification. Should we see a critical lab result, you will be contacted sooner.   If You Need Anything After Your Visit  If you have any questions or concerns for your doctor, please call our main line at [phone] If no one answers, please leave a voicemail as directed and we will return your call as soon as possible. Messages left after 4 pm will be answered the following business day.   You may also send us  a message via MyChart. We typically respond to MyChart messages within 1-2 business days.  For prescription refills, please ask your pharmacy to contact our office. Our fax number is [phone].  If you have an urgent issue when the clinic is closed that cannot wait until the next business day, you can page your doctor at the number below.    Please note that while we do our best to be available for urgent issues outside of office hours, we are not available 24/7.   If you have an urgent issue and are unable to reach us , you may choose to seek medical care at your doctor's office, retail clinic, urgent care center, or emergency room.  If you have a medical emergency, please immediately call 911 or go to the emergency department. In the event of inclement weather, please call our main line at [phone] for an update on the status of any delays or  closures.  Dermatology Medication Tips: Please keep the boxes that topical medications come in in order to help keep track of the instructions about where and how to use these. Pharmacies typically print the medication instructions only on the boxes and not directly on the medication tubes.   If your medication is too expensive, please contact our office at [phone] or send us  a message through MyChart.   We are unable to tell what your co-pay for medications will be in advance as this is different depending on your insurance coverage. However, we may be able to find a substitute medication at lower cost or fill out paperwork to get insurance to cover a needed medication.   If a prior authorization is required to get your medication covered by your insurance company, please allow us  1-2 business days to complete this process.  Drug prices often vary depending on where the prescription is filled and some pharmacies may offer cheaper prices.  The website www.goodrx.com contains coupons for medications through different pharmacies. The prices here do not account for what the cost may be with help from insurance (it may be cheaper with your insurance), but the website can give you the price if you did not use any insurance.  - You can print the associated coupon and take it with your prescription to the pharmacy.  - You may also stop by our office during regular business hours and pick up a GoodRx coupon card.  - If  you need your prescription sent electronically to a different pharmacy, notify our office through [facility] or by phone at [phone]    .gen

## 2023-12-05 NOTE — Progress Notes (Signed)
   Follow-Up Visit   Subjective  William Macias is a 48 y.o. male who presents for the following: Excision of a Moderate to Severa dysplastic nevus of the right 2nd toe, referred by Dr. Bary Likes.  The following portions of the chart were reviewed this encounter and updated as appropriate: medications, allergies, medical history  Review of Systems:  No other skin or systemic complaints except as noted in HPI or Assessment and Plan.  Objective  Well appearing patient in no apparent distress; mood and affect are within normal limits.  A focused examination was performed of the following areas: Right 2nd toe Relevant physical exam findings are noted in the Assessment and Plan.     Assessment & Plan   DYSPLASTIC NEVUS Right 2nd Toe Skin excision - Right 2nd Toe  Excision method:  elliptical Lesion length (cm):  0.6 Lesion width (cm):  0.7 Margin per side (cm):  0.5 Total excision diameter (cm):  1.7 Informed consent: discussed and consent obtained   Timeout: patient name, date of birth, surgical site, and procedure verified   Procedure prep:  Patient was prepped and draped in usual sterile fashion Prep type:  Chlorhexidine Anesthesia: the lesion was anesthetized in a standard fashion   Anesthetic:  1% lidocaine w/ epinephrine 1-100,000 buffered w/ 8.4% NaHCO3 Instrument used: #15 blade   Hemostasis achieved with: Gelfoam and electrodesiccation   Outcome: patient tolerated procedure well with no complications   Post-procedure details: sterile dressing applied and wound care instructions given   Dressing type: bandage and pressure dressing   Specimen 1 - Surgical pathology Differential Diagnosis: [id number] Check Margins: No  Return in about 4 weeks (around 01/02/2024) for mohs follow up.  Maurine Sovereign, RN, am acting as scribe for Deneise Finlay, MD .   Documentation: I have reviewed the above documentation for accuracy and completeness, and I agree with the  above.  Deneise Finlay, MD

## 2023-12-11 ENCOUNTER — Ambulatory Visit: Payer: Self-pay | Admitting: Dermatology

## 2023-12-11 LAB — SURGICAL PATHOLOGY

## 2023-12-12 NOTE — Telephone Encounter (Signed)
 L/m for pt w results and recommendation

## 2023-12-12 NOTE — Telephone Encounter (Signed)
-----   Message from [facility] PACI sent at 12/11/2023  3:32 PM EDT ----- Please communicated clear margins from [facility] for DN on right 2nd toe.

## 2024-01-07 ENCOUNTER — Ambulatory Visit: Admitting: Dermatology

## 2024-10-01 ENCOUNTER — Ambulatory Visit: Admitting: Dermatology
